# Patient Record
Sex: Female | Born: 1980 | Race: White | Hispanic: No | Marital: Married | Smoking: Never smoker
Health system: Southern US, Community
[De-identification: ages and names within clinical notes are randomized; demographics above are authoritative.]

## PROBLEM LIST (undated history)

## (undated) HISTORY — PX: OTHER SURGICAL HISTORY: SHX169

## (undated) HISTORY — PX: HAMSTRING AVULSION REPAIR: SHX5019

## (undated) HISTORY — PX: BLADDER SUSPENSION: SHX72

## (undated) HISTORY — PX: WISDOM TOOTH EXTRACTION: SHX21

## (undated) HISTORY — PX: BREAST SURGERY: SHX581

---

## 2017-08-06 ENCOUNTER — Ambulatory Visit (INDEPENDENT_AMBULATORY_CARE_PROVIDER_SITE_OTHER): Payer: BLUE CROSS/BLUE SHIELD | Admitting: Orthopaedic Surgery

## 2017-08-06 ENCOUNTER — Encounter (INDEPENDENT_AMBULATORY_CARE_PROVIDER_SITE_OTHER): Payer: Self-pay | Admitting: Orthopaedic Surgery

## 2017-08-06 ENCOUNTER — Ambulatory Visit (INDEPENDENT_AMBULATORY_CARE_PROVIDER_SITE_OTHER): Payer: BLUE CROSS/BLUE SHIELD

## 2017-08-06 DIAGNOSIS — L920 Granuloma annulare: Secondary | ICD-10-CM | POA: Diagnosis not present

## 2017-08-06 DIAGNOSIS — M25552 Pain in left hip: Secondary | ICD-10-CM

## 2017-08-06 DIAGNOSIS — L700 Acne vulgaris: Secondary | ICD-10-CM | POA: Diagnosis not present

## 2017-08-06 DIAGNOSIS — L218 Other seborrheic dermatitis: Secondary | ICD-10-CM | POA: Diagnosis not present

## 2017-08-06 MED ORDER — LIDOCAINE HCL 1 % IJ SOLN
3.0000 mL | INTRAMUSCULAR | Status: AC | PRN
  Administered 2017-08-06: 3 mL

## 2017-08-06 MED ORDER — BUPIVACAINE HCL 0.5 % IJ SOLN
3.0000 mL | INTRAMUSCULAR | Status: AC | PRN
  Administered 2017-08-06: 3 mL via INTRA_ARTICULAR

## 2017-08-06 MED ORDER — METHYLPREDNISOLONE ACETATE 40 MG/ML IJ SUSP
40.0000 mg | INTRAMUSCULAR | Status: AC | PRN
  Administered 2017-08-06: 40 mg via INTRA_ARTICULAR

## 2017-08-06 MED ORDER — DICLOFENAC EPOLAMINE 1.3 % TD PTCH: MEDICATED_PATCH | TRANSDERMAL | 1 refills | Status: DC

## 2017-08-06 NOTE — Progress Notes (Signed)
Office Visit Note   Patient: Dawn Maynard           Date of Birth: [birth year]           MRN: [id number] Visit Date: 08/06/2017              Requested by: No referring provider defined for this encounter. PCP: Patient, No Pcp Per   Assessment & Plan: Visit Diagnoses:  1. 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  We performed an injection into the symptomatic area under sterile conditions.  Patient did have good relief during the anesthetic phase.  I have also recommended foam rolling, physical therapy with modalities, NSAIDs as needed, heat as needed, Flector patches especially during running.    Follow-Up Instructions: Return if symptoms worsen or fail to improve.   Orders:  Orders Placed This Encounter  Procedures  . XR HIP UNILAT W OR W/O PELVIS 2-3 VIEWS LEFT   Meds ordered this encounter  Medications  . diclofenac (FLECTOR) 1.3 % PTCH    Sig: Cut the patch down to appropriate size and apply to affected area every 12 hrs    Dispense:  30 patch    Refill:  1      Procedures: Large Joint Inj: L greater trochanter on 08/06/2017 11:33 AM Indications: pain Details: 22 G needle Medications: 3 mL lidocaine 1 %; 3 mL bupivacaine 0.5 %; 40 mg methylPREDNISolone acetate 40 MG/ML Outcome: tolerated well, no immediate complications Patient was prepped and draped in the usual sterile fashion.       Clinical Data: No additional findings.   Subjective: Chief Complaint  Patient presents with  . 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  The pain is constant.  She does not endorse any radiation of pain.  Denies any numbness and tingling.  She is able to run through the pain but this is quite symptomatic.  She  denies any lateral hip pain or groin pain.  Denies any injuries.    Review of Systems  Constitutional: Negative.   HENT: Negative.   Eyes: Negative.   Respiratory: Negative.   Cardiovascular: Negative.   Endocrine: Negative.   Musculoskeletal: Negative.   Neurological: Negative.   Hematological: Negative.   Psychiatric/Behavioral: Negative.   All other systems reviewed and are negative.    Objective: Vital Signs: There were no vitals taken for this visit.  Physical Exam  Constitutional: She is oriented to person, place, and time. She appears well-developed and well-nourished.  HENT:  Head: Normocephalic and atraumatic.  Eyes: EOM are normal.  Neck: Neck supple.  Pulmonary/Chest: Effort normal.  Abdominal: Soft.  Neurological: She is alert and oriented to person, place, and time.  Skin: Skin is warm. Capillary refill takes less than 2 seconds.  Psychiatric: She has a normal mood and affect. Her behavior is normal. Judgment and thought content normal.  Nursing note and vitals reviewed.   Ortho Exam Left hip exam shows full painless range of motion without any reproducible pain.  Her pain is localized to just posterior to the iliac wing at the insertion of the gluteus maximus.  There is no overlying skin changes or swelling.  Trochanter is nontender.  Negative Stinchfield sign.  Negative straight leg. Specialty Comments:  No specialty comments available.  Imaging: Xr Hip Unilat W Or W/o Pelvis 2-3 Views Left  Result Date: 08/06/2017 No acute or structural abnormalities.      PMFS History: There are no active problems to display for this patient.  History reviewed. No pertinent past medical history.  History reviewed. No pertinent family history.  History reviewed. No pertinent surgical history. Social History   Occupational History  . Not on file  Tobacco Use  . Smoking status: Never Smoker  . Smokeless tobacco: Never Used  Substance and Sexual Activity  .  Alcohol use: Not on file  . Drug use: Not on file  . Sexual activity: Not on file

## 2017-08-09 ENCOUNTER — Ambulatory Visit (INDEPENDENT_AMBULATORY_CARE_PROVIDER_SITE_OTHER): Payer: Self-pay | Admitting: Orthopaedic Surgery

## 2017-08-18 ENCOUNTER — Telehealth (INDEPENDENT_AMBULATORY_CARE_PROVIDER_SITE_OTHER): Payer: Self-pay | Admitting: Orthopaedic Surgery

## 2017-08-18 ENCOUNTER — Other Ambulatory Visit (INDEPENDENT_AMBULATORY_CARE_PROVIDER_SITE_OTHER): Payer: Self-pay | Admitting: Orthopaedic Surgery

## 2017-08-18 DIAGNOSIS — M25552 Pain in left hip: Secondary | ICD-10-CM

## 2017-08-18 MED ORDER — METHYLPREDNISOLONE 4 MG PO TBPK: ORAL_TABLET | ORAL | 0 refills | Status: DC

## 2017-08-18 NOTE — Telephone Encounter (Signed)
Spoke with her on phone.  We're going to plan on 4 weeks off from running.  In the meantime, she'll swim or bike.  I'll send in prednisone taper.  If you can please fax in order to [facility] for dry needling for her.  Eval and treat.  If not better, then MRI of the pelvis.  Thanks.

## 2017-08-18 NOTE — Telephone Encounter (Signed)
See message below  Please advise

## 2017-08-18 NOTE — Telephone Encounter (Signed)
Order faxed.

## 2017-08-18 NOTE — Telephone Encounter (Signed)
Patient called asking about the medication for the hip swelling, also wanted to know if you would suggest an MRI. CB # [phone]

## 2017-08-20 ENCOUNTER — Telehealth (INDEPENDENT_AMBULATORY_CARE_PROVIDER_SITE_OTHER): Payer: Self-pay | Admitting: *Deleted

## 2017-08-20 NOTE — Telephone Encounter (Signed)
Received fax from [facility]  [address] advising of appt scheduled on 08/26/17, per fax pt aware fo appt

## 2017-08-26 DIAGNOSIS — M6281 Muscle weakness (generalized): Secondary | ICD-10-CM | POA: Diagnosis not present

## 2017-08-26 DIAGNOSIS — M25652 Stiffness of left hip, not elsewhere classified: Secondary | ICD-10-CM | POA: Diagnosis not present

## 2017-08-26 DIAGNOSIS — M25552 Pain in left hip: Secondary | ICD-10-CM | POA: Diagnosis not present

## 2017-08-26 DIAGNOSIS — R262 Difficulty in walking, not elsewhere classified: Secondary | ICD-10-CM | POA: Diagnosis not present

## 2017-09-03 DIAGNOSIS — M6281 Muscle weakness (generalized): Secondary | ICD-10-CM | POA: Diagnosis not present

## 2017-09-03 DIAGNOSIS — R262 Difficulty in walking, not elsewhere classified: Secondary | ICD-10-CM | POA: Diagnosis not present

## 2017-09-03 DIAGNOSIS — M25552 Pain in left hip: Secondary | ICD-10-CM | POA: Diagnosis not present

## 2017-09-03 DIAGNOSIS — M25652 Stiffness of left hip, not elsewhere classified: Secondary | ICD-10-CM | POA: Diagnosis not present

## 2017-09-07 DIAGNOSIS — R262 Difficulty in walking, not elsewhere classified: Secondary | ICD-10-CM | POA: Diagnosis not present

## 2017-09-07 DIAGNOSIS — M6281 Muscle weakness (generalized): Secondary | ICD-10-CM | POA: Diagnosis not present

## 2017-09-07 DIAGNOSIS — M25652 Stiffness of left hip, not elsewhere classified: Secondary | ICD-10-CM | POA: Diagnosis not present

## 2017-09-07 DIAGNOSIS — M25552 Pain in left hip: Secondary | ICD-10-CM | POA: Diagnosis not present

## 2017-09-14 DIAGNOSIS — R262 Difficulty in walking, not elsewhere classified: Secondary | ICD-10-CM | POA: Diagnosis not present

## 2017-09-14 DIAGNOSIS — M6281 Muscle weakness (generalized): Secondary | ICD-10-CM | POA: Diagnosis not present

## 2017-09-14 DIAGNOSIS — M25652 Stiffness of left hip, not elsewhere classified: Secondary | ICD-10-CM | POA: Diagnosis not present

## 2017-09-14 DIAGNOSIS — M25552 Pain in left hip: Secondary | ICD-10-CM | POA: Diagnosis not present

## 2017-09-17 DIAGNOSIS — M25652 Stiffness of left hip, not elsewhere classified: Secondary | ICD-10-CM | POA: Diagnosis not present

## 2017-09-17 DIAGNOSIS — M25552 Pain in left hip: Secondary | ICD-10-CM | POA: Diagnosis not present

## 2017-09-17 DIAGNOSIS — R262 Difficulty in walking, not elsewhere classified: Secondary | ICD-10-CM | POA: Diagnosis not present

## 2017-09-17 DIAGNOSIS — M6281 Muscle weakness (generalized): Secondary | ICD-10-CM | POA: Diagnosis not present

## 2017-09-20 DIAGNOSIS — R262 Difficulty in walking, not elsewhere classified: Secondary | ICD-10-CM | POA: Diagnosis not present

## 2017-09-20 DIAGNOSIS — M6281 Muscle weakness (generalized): Secondary | ICD-10-CM | POA: Diagnosis not present

## 2017-09-20 DIAGNOSIS — M25652 Stiffness of left hip, not elsewhere classified: Secondary | ICD-10-CM | POA: Diagnosis not present

## 2017-09-20 DIAGNOSIS — M25552 Pain in left hip: Secondary | ICD-10-CM | POA: Diagnosis not present

## 2017-09-23 DIAGNOSIS — M6281 Muscle weakness (generalized): Secondary | ICD-10-CM | POA: Diagnosis not present

## 2017-09-23 DIAGNOSIS — M25652 Stiffness of left hip, not elsewhere classified: Secondary | ICD-10-CM | POA: Diagnosis not present

## 2017-09-23 DIAGNOSIS — R262 Difficulty in walking, not elsewhere classified: Secondary | ICD-10-CM | POA: Diagnosis not present

## 2017-09-23 DIAGNOSIS — M25552 Pain in left hip: Secondary | ICD-10-CM | POA: Diagnosis not present

## 2017-10-05 DIAGNOSIS — M6281 Muscle weakness (generalized): Secondary | ICD-10-CM | POA: Diagnosis not present

## 2017-10-05 DIAGNOSIS — R262 Difficulty in walking, not elsewhere classified: Secondary | ICD-10-CM | POA: Diagnosis not present

## 2017-10-05 DIAGNOSIS — M25652 Stiffness of left hip, not elsewhere classified: Secondary | ICD-10-CM | POA: Diagnosis not present

## 2017-10-05 DIAGNOSIS — M25552 Pain in left hip: Secondary | ICD-10-CM | POA: Diagnosis not present

## 2017-11-01 DIAGNOSIS — M25652 Stiffness of left hip, not elsewhere classified: Secondary | ICD-10-CM | POA: Diagnosis not present

## 2017-11-01 DIAGNOSIS — M6281 Muscle weakness (generalized): Secondary | ICD-10-CM | POA: Diagnosis not present

## 2017-11-01 DIAGNOSIS — R262 Difficulty in walking, not elsewhere classified: Secondary | ICD-10-CM | POA: Diagnosis not present

## 2017-11-01 DIAGNOSIS — M25552 Pain in left hip: Secondary | ICD-10-CM | POA: Diagnosis not present

## 2017-11-05 DIAGNOSIS — L700 Acne vulgaris: Secondary | ICD-10-CM | POA: Diagnosis not present

## 2017-11-05 DIAGNOSIS — L218 Other seborrheic dermatitis: Secondary | ICD-10-CM | POA: Diagnosis not present

## 2017-11-05 DIAGNOSIS — L920 Granuloma annulare: Secondary | ICD-10-CM | POA: Diagnosis not present

## 2017-11-05 DIAGNOSIS — Z79899 Other long term (current) drug therapy: Secondary | ICD-10-CM | POA: Diagnosis not present

## 2017-11-11 DIAGNOSIS — M25552 Pain in left hip: Secondary | ICD-10-CM | POA: Diagnosis not present

## 2017-11-11 DIAGNOSIS — M25652 Stiffness of left hip, not elsewhere classified: Secondary | ICD-10-CM | POA: Diagnosis not present

## 2017-11-11 DIAGNOSIS — M6281 Muscle weakness (generalized): Secondary | ICD-10-CM | POA: Diagnosis not present

## 2017-11-11 DIAGNOSIS — R262 Difficulty in walking, not elsewhere classified: Secondary | ICD-10-CM | POA: Diagnosis not present

## 2017-11-22 DIAGNOSIS — L920 Granuloma annulare: Secondary | ICD-10-CM | POA: Diagnosis not present

## 2017-12-21 DIAGNOSIS — R945 Abnormal results of liver function studies: Secondary | ICD-10-CM | POA: Diagnosis not present

## 2017-12-21 DIAGNOSIS — E782 Mixed hyperlipidemia: Secondary | ICD-10-CM | POA: Diagnosis not present

## 2017-12-21 DIAGNOSIS — Z0000 Encounter for general adult medical examination without abnormal findings: Secondary | ICD-10-CM | POA: Diagnosis not present

## 2017-12-21 DIAGNOSIS — Z1329 Encounter for screening for other suspected endocrine disorder: Secondary | ICD-10-CM | POA: Diagnosis not present

## 2017-12-21 DIAGNOSIS — Z114 Encounter for screening for human immunodeficiency virus [HIV]: Secondary | ICD-10-CM | POA: Diagnosis not present

## 2017-12-24 ENCOUNTER — Other Ambulatory Visit: Payer: Self-pay | Admitting: Family Medicine

## 2017-12-24 DIAGNOSIS — Z13220 Encounter for screening for lipoid disorders: Secondary | ICD-10-CM | POA: Diagnosis not present

## 2017-12-24 DIAGNOSIS — R945 Abnormal results of liver function studies: Secondary | ICD-10-CM | POA: Diagnosis not present

## 2017-12-24 DIAGNOSIS — Z6820 Body mass index (BMI) 20.0-20.9, adult: Secondary | ICD-10-CM | POA: Diagnosis not present

## 2017-12-24 DIAGNOSIS — Z0000 Encounter for general adult medical examination without abnormal findings: Secondary | ICD-10-CM | POA: Diagnosis not present

## 2017-12-24 DIAGNOSIS — M316 Other giant cell arteritis: Secondary | ICD-10-CM | POA: Diagnosis not present

## 2017-12-24 DIAGNOSIS — R7989 Other specified abnormal findings of blood chemistry: Secondary | ICD-10-CM

## 2018-01-03 ENCOUNTER — Ambulatory Visit
Admission: RE | Admit: 2018-01-03 | Discharge: 2018-01-03 | Disposition: A | Discharge status: Home / Self Care | Payer: BLUE CROSS/BLUE SHIELD | Source: Ambulatory Visit | Attending: Family Medicine | Admitting: Family Medicine

## 2018-01-03 DIAGNOSIS — R945 Abnormal results of liver function studies: Principal | ICD-10-CM

## 2018-01-03 DIAGNOSIS — R7989 Other specified abnormal findings of blood chemistry: Secondary | ICD-10-CM

## 2018-01-03 DIAGNOSIS — D1809 Hemangioma of other sites: Secondary | ICD-10-CM | POA: Diagnosis not present

## 2018-01-21 DIAGNOSIS — Z01419 Encounter for gynecological examination (general) (routine) without abnormal findings: Secondary | ICD-10-CM | POA: Diagnosis not present

## 2018-01-21 DIAGNOSIS — Z681 Body mass index (BMI) 19 or less, adult: Secondary | ICD-10-CM | POA: Diagnosis not present

## 2018-02-01 DIAGNOSIS — R945 Abnormal results of liver function studies: Secondary | ICD-10-CM | POA: Diagnosis not present

## 2018-02-01 DIAGNOSIS — Z0000 Encounter for general adult medical examination without abnormal findings: Secondary | ICD-10-CM | POA: Diagnosis not present

## 2018-02-03 DIAGNOSIS — R945 Abnormal results of liver function studies: Secondary | ICD-10-CM | POA: Diagnosis not present

## 2018-02-03 DIAGNOSIS — Z6820 Body mass index (BMI) 20.0-20.9, adult: Secondary | ICD-10-CM | POA: Diagnosis not present

## 2018-02-23 DIAGNOSIS — Z30432 Encounter for removal of intrauterine contraceptive device: Secondary | ICD-10-CM | POA: Diagnosis not present

## 2018-03-04 DIAGNOSIS — H10811 Pingueculitis, right eye: Secondary | ICD-10-CM | POA: Diagnosis not present

## 2018-03-14 ENCOUNTER — Other Ambulatory Visit (INDEPENDENT_AMBULATORY_CARE_PROVIDER_SITE_OTHER): Payer: Self-pay | Admitting: Orthopaedic Surgery

## 2018-03-15 NOTE — Telephone Encounter (Signed)
Yes, please put 11 refills on it

## 2018-04-11 ENCOUNTER — Ambulatory Visit (INDEPENDENT_AMBULATORY_CARE_PROVIDER_SITE_OTHER): Payer: BLUE CROSS/BLUE SHIELD | Admitting: Orthopaedic Surgery

## 2018-04-11 ENCOUNTER — Other Ambulatory Visit (INDEPENDENT_AMBULATORY_CARE_PROVIDER_SITE_OTHER): Payer: Self-pay

## 2018-04-11 DIAGNOSIS — M25552 Pain in left hip: Secondary | ICD-10-CM

## 2018-04-11 DIAGNOSIS — R102 Pelvic and perineal pain: Secondary | ICD-10-CM

## 2018-04-11 NOTE — Progress Notes (Deleted)
   Office Visit Note   Patient: Dawn Maynard           Date of Birth: 1980-12-02           MRN: [id number] Visit Date: 04/11/2018              Requested by: Lewis Moccasinewey, Elizabeth R, MD [address] [place] [id number] PCP: Lewis Moccasinewey, Elizabeth R, MD   Assessment & Plan: Visit Diagnoses: Right posterior pelvic pain  Plan: Vena Austrialeanor continues to have pain in this region despite conservative treatment with relative rest and cortisone injection.  At this point she needs an MRI of the pelvis to rule out occult fracture or malignancy.  Follow-Up Instructions: No follow-ups on file.   Orders:  No orders of the defined types were placed in this encounter.  No orders of the defined types were placed in this encounter.     Procedures: No procedures performed   Clinical Data: No additional findings.   Subjective: No chief complaint on file.   Vena Austrialeanor continues to have pain along the posterior superior iliac crest that has recently caused further withdrawal from a race.  The pain does not radiate.  She denies any back pain.  Denies any groin pain or lateral hip pain.   Review of Systems   Objective: Vital Signs: There were no vitals taken for this visit.  Physical Exam  Ortho Exam She is tender to the posterior superior iliac crest.  There is no overlying skin changes.  Negative sciatic tension signs.  Painless range of motion of the hip. Specialty Comments:  No specialty comments available.  Imaging: No results found.   PMFS History: There are no active problems to display for this patient.  No past medical history on file.  No family history on file.  No past surgical history on file. Social History   Occupational History  . Not on file  Tobacco Use  . Smoking status: Never Smoker  . Smokeless tobacco: Never Used  Substance and Sexual Activity  . Alcohol use: Not on file  . Drug use: Not on file  . Sexual activity: Not on file

## 2018-04-13 ENCOUNTER — Ambulatory Visit
Admission: RE | Admit: 2018-04-13 | Discharge: 2018-04-13 | Disposition: A | Discharge status: Home / Self Care | Payer: BLUE CROSS/BLUE SHIELD | Source: Ambulatory Visit | Attending: Orthopaedic Surgery | Admitting: Orthopaedic Surgery

## 2018-04-13 DIAGNOSIS — R600 Localized edema: Secondary | ICD-10-CM | POA: Diagnosis not present

## 2018-04-13 DIAGNOSIS — M25552 Pain in left hip: Secondary | ICD-10-CM

## 2018-04-13 DIAGNOSIS — R102 Pelvic and perineal pain: Secondary | ICD-10-CM

## 2018-04-15 ENCOUNTER — Ambulatory Visit (INDEPENDENT_AMBULATORY_CARE_PROVIDER_SITE_OTHER): Payer: BLUE CROSS/BLUE SHIELD | Admitting: Family Medicine

## 2018-04-15 ENCOUNTER — Encounter (INDEPENDENT_AMBULATORY_CARE_PROVIDER_SITE_OTHER): Payer: Self-pay | Admitting: Family Medicine

## 2018-04-15 DIAGNOSIS — M25561 Pain in right knee: Secondary | ICD-10-CM | POA: Diagnosis not present

## 2018-04-15 DIAGNOSIS — M25551 Pain in right hip: Secondary | ICD-10-CM

## 2018-04-15 DIAGNOSIS — G8929 Other chronic pain: Secondary | ICD-10-CM | POA: Diagnosis not present

## 2018-04-15 NOTE — Progress Notes (Signed)
Office Visit Note   Patient: Dawn Maynard           Date of Birth: 1981/05/19           MRN: [id number] Visit Date: 04/15/2018 Requested by: Lewis Moccasinewey, Elizabeth R, MD [address] [place], [place] [id number] PCP: Lewis Moccasinewey, Elizabeth R, MD  Subjective: Chief Complaint  Patient presents with  . Right Hip - Pain    PRP injection    HPI: She is here with chronic right lateral hip pain.  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  She has had pain in the right posterior lateral hip for almost a year.  She had an injection cortisone lasting a couple weeks.  She had an MRI scan edema in the gluteus medius minimus muscles but no definite tear of muscle or tendon.  No sign of stress fracture.  She does note that a couple years ago she had a partial tear of her right hamstring tendon, semi-tendinosis/semi-member gnosis requiring debridement.  She states that it is better, but still hurts.  Proper foot he does not have a history of leg length discrepancy.  No history of stress fractures.               ROS: Otherwise noncontributory  Objective: Vital Signs: There were no vitals taken for this visit.  Physical Exam:  Right hip: Leg lengths are equal, no lumbar scoliosis.  No pain with passive internal/external hip rotation in the groin area.  Her tenderness is at the lateral hip about 3 cm below the iliac crest and 2 cm behind the anterior superior iliac spine.  The muscle seems slightly atrophied compared to the left side.  No pain over the greater trochanter. Right knee: Surgical scar at the medial knee, still slightly tender near the medial hamstring tendons.  Imaging: Musculoskeletal ultrasound: Right hip was imaged in the area of maximum tenderness.  There appears to be hypoechoic swelling in the gluteus medius muscle tissue.  Right knee was imaged as well at the area of maximum tenderness.  It is anterior to the hamstring tendons.  No definite abnormality  seen.  Assessment & Plan: 1.  Chronic right lateral hip pain with gluteus medius/minimus muscle strain, question whether due to favoring her right hamstring injury at the knee. -We will try PRP injection today.  She will rest for a few days and then gradually resume her training regimen.   -We will also inject her right knee today with PRP.   Follow-Up Instructions: No follow-ups on file.       Procedures: Right hip and right knee PRP injection: After sterile prep with iodine, injected just under 3 cc of PRP using a 22-gauge spinal needle into the hypoechoic muscle tissue of the right gluteus medius without complication.  After sterile prep with Betadine, injected 2 cc PRP using a 22-gauge 1-1/4 inch needle into the area of maximum tenderness at the right medial knee.   PMFS History: There are no active problems to display for this patient.  History reviewed. No pertinent past medical history.  History reviewed. No pertinent family history.  History reviewed. No pertinent surgical history. Social History   Occupational History  . Not on file  Tobacco Use  . Smoking status: Never Smoker  . Smokeless tobacco: Never Used  Substance and Sexual Activity  . Alcohol use: Not on file  . Drug use: Not on file  . Sexual activity: Not on file

## 2018-04-16 ENCOUNTER — Other Ambulatory Visit (INDEPENDENT_AMBULATORY_CARE_PROVIDER_SITE_OTHER): Payer: Self-pay | Admitting: Orthopaedic Surgery

## 2018-04-16 MED ORDER — NITROGLYCERIN 0.2 MG/HR TD PT24: MEDICATED_PATCH | TRANSDERMAL | 12 refills | Status: DC

## 2018-05-03 DIAGNOSIS — L218 Other seborrheic dermatitis: Secondary | ICD-10-CM | POA: Diagnosis not present

## 2018-05-03 DIAGNOSIS — L920 Granuloma annulare: Secondary | ICD-10-CM | POA: Diagnosis not present

## 2018-06-15 DIAGNOSIS — R51 Headache: Secondary | ICD-10-CM | POA: Diagnosis not present

## 2018-06-15 DIAGNOSIS — Z6820 Body mass index (BMI) 20.0-20.9, adult: Secondary | ICD-10-CM | POA: Diagnosis not present

## 2018-06-15 DIAGNOSIS — E559 Vitamin D deficiency, unspecified: Secondary | ICD-10-CM | POA: Diagnosis not present

## 2018-06-15 DIAGNOSIS — J309 Allergic rhinitis, unspecified: Secondary | ICD-10-CM | POA: Diagnosis not present

## 2018-06-15 DIAGNOSIS — R5383 Other fatigue: Secondary | ICD-10-CM | POA: Diagnosis not present

## 2018-06-21 ENCOUNTER — Encounter (INDEPENDENT_AMBULATORY_CARE_PROVIDER_SITE_OTHER): Payer: Self-pay | Admitting: Family Medicine

## 2018-06-21 ENCOUNTER — Ambulatory Visit (INDEPENDENT_AMBULATORY_CARE_PROVIDER_SITE_OTHER): Payer: BLUE CROSS/BLUE SHIELD | Admitting: Family Medicine

## 2018-06-21 DIAGNOSIS — M25551 Pain in right hip: Secondary | ICD-10-CM | POA: Diagnosis not present

## 2018-06-21 MED ORDER — PREDNISONE 10 MG PO TABS: ORAL_TABLET | ORAL | 0 refills | Status: AC

## 2018-06-21 MED ORDER — METHYLPREDNISOLONE ACETATE 40 MG/ML IJ SUSP
40.0000 mg | INTRAMUSCULAR | Status: AC | PRN
  Administered 2018-06-21: 40 mg via INTRA_ARTICULAR

## 2018-06-21 MED ORDER — LIDOCAINE HCL (PF) 1 % IJ SOLN
5.0000 mL | INTRAMUSCULAR | Status: AC | PRN
  Administered 2018-06-21: 5 mL

## 2018-06-21 NOTE — Progress Notes (Signed)
   Office Visit Note   Patient: Dawn Maynard           Date of Birth: [birth year]           MRN: [id number] Visit Date: 06/21/2018 Requested by: Lewis Moccasinewey, Elizabeth R, MD [address] [place], [place] [id number] PCP: Lewis Moccasinewey, Elizabeth R, MD  Subjective: Chief Complaint  Patient presents with  . Right Hip - Pain, Follow-up    Ultrasound-guided injection    HPI: She is here with recurrent right hip pain.  PRP injection did not help.  It definitely helped her knee pain but not her hip.  She has a qualifying marathon in about 2 weeks.  She would like to try a cortisone injection.              ROS: Noncontributory  Objective: Vital Signs: There were no vitals taken for this visit.  Physical Exam:  Right hip: She is tender to deep palpation of tensor fascia lata muscle, she has a symptomatic trigger point in this area that seems to be reproducing her pain.  Imaging: Musculoskeletal ultrasound: I imaged her posterior right hip.  I was able to locate the piriformis muscle exiting the foramen and identified the superior gluteal artery.  It seemed to be superior to the muscle, not within it.  I was not able to definitively see the superior gluteal nerve to determine whether there is any entrapment.  Assessment & Plan: 1.  Chronic right hip pain with edema in the gluteus minimus muscle on MRI scan, question superior gluteal nerve entrapment at level of piriformis -Discussed options with her, elected to inject the symptomatic trigger point at the area of maximum pain and also to do an ultrasound-guided injection around the superior gluteal nerve. -Oral prednisone taper.   Follow-Up Instructions: No follow-ups on file.      Procedures: Large Joint Inj: R hip joint on 06/21/2018 2:39 PM Details: 22 G 3.5 in needle, ultrasound-guided posterior approach  Arthrogram: No  Medications: 5 mL lidocaine (PF) 1 %; 40 mg methylPREDNISolone acetate 40 MG/ML  Injected 5 cc 1% lidocaine  without epinephrine and 20 mg methylprednisolone using ultrasound to guide needle placement around the superior gluteal nerve artery.  Also without ultrasound guidance, injected 3 cc without epinephrine and 20 mg methylprednisolone into the area of maximum tenderness, myofascial trigger point, at the region of the gluteus minimus muscle belly. Procedure, treatment alternatives, risks and benefits explained, specific risks discussed. Consent was given by the patient. Immediately prior to procedure a time out was called to verify the correct patient, procedure, equipment, support staff and site/side marked as required. Patient was prepped and draped in the usual sterile fashion.      No notes on file    PMFS History: There are no active problems to display for this patient.  No past medical history on file.  No family history on file.  No past surgical history on file. Social History   Occupational History  . Not on file  Tobacco Use  . Smoking status: Never Smoker  . Smokeless tobacco: Never Used  Substance and Sexual Activity  . Alcohol use: Yes    Alcohol/week: 1.0 - 3.0 standard drinks    Types: 1 - 3 Glasses of wine per week  . Drug use: Never  . Sexual activity: Not on file

## 2018-08-02 ENCOUNTER — Telehealth (INDEPENDENT_AMBULATORY_CARE_PROVIDER_SITE_OTHER): Payer: Self-pay | Admitting: Orthopaedic Surgery

## 2018-08-02 ENCOUNTER — Ambulatory Visit (INDEPENDENT_AMBULATORY_CARE_PROVIDER_SITE_OTHER): Payer: BLUE CROSS/BLUE SHIELD | Admitting: Orthopaedic Surgery

## 2018-08-02 DIAGNOSIS — M25551 Pain in right hip: Secondary | ICD-10-CM

## 2018-08-02 NOTE — Telephone Encounter (Signed)
See progress note.

## 2018-08-02 NOTE — Progress Notes (Addendum)
   Office Visit Note   Patient: Dawn Maynard           Date of Birth: 11-20-1980           MRN: [id number] Visit Date: 08/02/2018              Requested by: Lewis Moccasin, MD [address] [place], [place] [address] PCP: Lewis Moccasin, MD   Assessment & Plan: Visit Diagnoses:  1. Pain of right hip joint     Plan: In light of her recent injury and continued pain even at rest and the fact that she also has pain in her back I would like to repeat the MRI of her right hip to rule out a tear in her abductor tendon as well as an MRI of her lumbar spine to rule out structural abnormalities as a source of her continued right hip pain.  We will submit this order today and I will follow-up with her regarding the results of the MRI.  Follow-Up Instructions: Return if symptoms worsen or fail to improve.   Orders:  No orders of the defined types were placed in this encounter.  No orders of the defined types were placed in this encounter.     Procedures: No procedures performed   Clinical Data: No additional findings.   Subjective: No chief complaint on file.   Dawn Maynard is a 38 year old female who I have been treating since January 2019 for chronic right hip pain.  She had an MRI in September of her right hip which showed a small tear of her abductors and gluteal aponeurosis.  She treated this with rest and physical therapy and she tried PRP and as well as a cortisone injection.  She has not noticed a significant improvement with these measures.  She comes in today for repeat evaluation of the right hip.  She also recently had a fall onto the right hip which is now caused her symptoms to become much worse.  She states that her low back also feels tight and feels discomfort.  She states that her right hip pain is severe and now she has pain in her right hip and back even at rest.   Review of Systems   Objective: Vital Signs: There were no vitals taken for this  visit.  Physical Exam  Ortho Exam Right hip exam shows dull tenderness to palpation in the abductors as well as along the posterior iliac crest.  She has weakness and pain with resisted hip abduction. Lumbar spine is mildly tender to palpation.  Negative sciatic tension signs.  Specialty Comments:  No specialty comments available.  Imaging: No results found.   PMFS History: There are no active problems to display for this patient.  No past medical history on file.  No family history on file.  No past surgical history on file. Social History   Occupational History  . Not on file  Tobacco Use  . Smoking status: Never Smoker  . Smokeless tobacco: Never Used  Substance and Sexual Activity  . Alcohol use: Yes    Alcohol/week: 1.0 - 3.0 standard drinks    Types: 1 - 3 Glasses of wine per week  . Drug use: Never  . Sexual activity: Not on file

## 2018-08-03 ENCOUNTER — Other Ambulatory Visit (INDEPENDENT_AMBULATORY_CARE_PROVIDER_SITE_OTHER): Payer: Self-pay

## 2018-08-03 DIAGNOSIS — M545 Low back pain, unspecified: Secondary | ICD-10-CM

## 2018-08-03 DIAGNOSIS — M25551 Pain in right hip: Secondary | ICD-10-CM

## 2018-08-03 DIAGNOSIS — G8929 Other chronic pain: Secondary | ICD-10-CM

## 2018-08-07 ENCOUNTER — Ambulatory Visit
Admission: RE | Admit: 2018-08-07 | Discharge: 2018-08-07 | Disposition: A | Discharge status: Home / Self Care | Payer: BLUE CROSS/BLUE SHIELD | Source: Ambulatory Visit | Attending: Orthopaedic Surgery | Admitting: Orthopaedic Surgery

## 2018-08-07 DIAGNOSIS — M25551 Pain in right hip: Secondary | ICD-10-CM

## 2018-08-07 DIAGNOSIS — M545 Low back pain, unspecified: Secondary | ICD-10-CM

## 2018-08-07 DIAGNOSIS — G8929 Other chronic pain: Secondary | ICD-10-CM

## 2018-08-22 DIAGNOSIS — M5126 Other intervertebral disc displacement, lumbar region: Secondary | ICD-10-CM | POA: Diagnosis not present

## 2018-08-22 DIAGNOSIS — M9904 Segmental and somatic dysfunction of sacral region: Secondary | ICD-10-CM | POA: Diagnosis not present

## 2018-08-22 DIAGNOSIS — M9905 Segmental and somatic dysfunction of pelvic region: Secondary | ICD-10-CM | POA: Diagnosis not present

## 2018-08-22 DIAGNOSIS — M9903 Segmental and somatic dysfunction of lumbar region: Secondary | ICD-10-CM | POA: Diagnosis not present

## 2018-08-26 DIAGNOSIS — M9904 Segmental and somatic dysfunction of sacral region: Secondary | ICD-10-CM | POA: Diagnosis not present

## 2018-08-26 DIAGNOSIS — M9905 Segmental and somatic dysfunction of pelvic region: Secondary | ICD-10-CM | POA: Diagnosis not present

## 2018-08-26 DIAGNOSIS — M5126 Other intervertebral disc displacement, lumbar region: Secondary | ICD-10-CM | POA: Diagnosis not present

## 2018-08-26 DIAGNOSIS — M9903 Segmental and somatic dysfunction of lumbar region: Secondary | ICD-10-CM | POA: Diagnosis not present

## 2018-08-28 ENCOUNTER — Other Ambulatory Visit (INDEPENDENT_AMBULATORY_CARE_PROVIDER_SITE_OTHER): Payer: Self-pay | Admitting: Orthopaedic Surgery

## 2018-08-28 MED ORDER — PREDNISONE 10 MG (21) PO TBPK: ORAL_TABLET | ORAL | 0 refills | Status: DC

## 2018-08-30 DIAGNOSIS — M9905 Segmental and somatic dysfunction of pelvic region: Secondary | ICD-10-CM | POA: Diagnosis not present

## 2018-08-30 DIAGNOSIS — M9904 Segmental and somatic dysfunction of sacral region: Secondary | ICD-10-CM | POA: Diagnosis not present

## 2018-08-30 DIAGNOSIS — M5126 Other intervertebral disc displacement, lumbar region: Secondary | ICD-10-CM | POA: Diagnosis not present

## 2018-08-30 DIAGNOSIS — M9903 Segmental and somatic dysfunction of lumbar region: Secondary | ICD-10-CM | POA: Diagnosis not present

## 2018-09-07 DIAGNOSIS — M9904 Segmental and somatic dysfunction of sacral region: Secondary | ICD-10-CM | POA: Diagnosis not present

## 2018-09-07 DIAGNOSIS — M5126 Other intervertebral disc displacement, lumbar region: Secondary | ICD-10-CM | POA: Diagnosis not present

## 2018-09-07 DIAGNOSIS — M9903 Segmental and somatic dysfunction of lumbar region: Secondary | ICD-10-CM | POA: Diagnosis not present

## 2018-09-07 DIAGNOSIS — M9905 Segmental and somatic dysfunction of pelvic region: Secondary | ICD-10-CM | POA: Diagnosis not present

## 2018-11-14 DIAGNOSIS — L218 Other seborrheic dermatitis: Secondary | ICD-10-CM | POA: Diagnosis not present

## 2018-11-14 DIAGNOSIS — L920 Granuloma annulare: Secondary | ICD-10-CM | POA: Diagnosis not present

## 2019-01-31 ENCOUNTER — Other Ambulatory Visit: Payer: Self-pay | Admitting: Orthopaedic Surgery

## 2019-01-31 MED ORDER — PREDNISONE 10 MG (21) PO TBPK: ORAL_TABLET | ORAL | 0 refills | Status: DC

## 2019-01-31 MED ORDER — CELECOXIB 200 MG PO CAPS: 200.0000 mg | ORAL_CAPSULE | Freq: Two times a day (BID) | ORAL | 3 refills | Status: DC

## 2019-02-20 DIAGNOSIS — L218 Other seborrheic dermatitis: Secondary | ICD-10-CM | POA: Diagnosis not present

## 2019-02-20 DIAGNOSIS — L920 Granuloma annulare: Secondary | ICD-10-CM | POA: Diagnosis not present

## 2019-03-02 DIAGNOSIS — Z01419 Encounter for gynecological examination (general) (routine) without abnormal findings: Secondary | ICD-10-CM | POA: Diagnosis not present

## 2019-03-02 DIAGNOSIS — Z681 Body mass index (BMI) 19 or less, adult: Secondary | ICD-10-CM | POA: Diagnosis not present

## 2019-03-20 DIAGNOSIS — R1903 Right lower quadrant abdominal swelling, mass and lump: Secondary | ICD-10-CM | POA: Diagnosis not present

## 2019-04-06 DIAGNOSIS — M25551 Pain in right hip: Secondary | ICD-10-CM | POA: Diagnosis not present

## 2019-04-06 DIAGNOSIS — M25561 Pain in right knee: Secondary | ICD-10-CM | POA: Diagnosis not present

## 2019-04-06 DIAGNOSIS — M25571 Pain in right ankle and joints of right foot: Secondary | ICD-10-CM | POA: Diagnosis not present

## 2019-04-06 DIAGNOSIS — M545 Low back pain: Secondary | ICD-10-CM | POA: Diagnosis not present

## 2019-04-11 DIAGNOSIS — M25551 Pain in right hip: Secondary | ICD-10-CM | POA: Diagnosis not present

## 2019-04-11 DIAGNOSIS — M545 Low back pain: Secondary | ICD-10-CM | POA: Diagnosis not present

## 2019-04-11 DIAGNOSIS — M25561 Pain in right knee: Secondary | ICD-10-CM | POA: Diagnosis not present

## 2019-04-11 DIAGNOSIS — M25571 Pain in right ankle and joints of right foot: Secondary | ICD-10-CM | POA: Diagnosis not present

## 2019-04-14 DIAGNOSIS — M25571 Pain in right ankle and joints of right foot: Secondary | ICD-10-CM | POA: Diagnosis not present

## 2019-04-14 DIAGNOSIS — M25551 Pain in right hip: Secondary | ICD-10-CM | POA: Diagnosis not present

## 2019-04-14 DIAGNOSIS — M25561 Pain in right knee: Secondary | ICD-10-CM | POA: Diagnosis not present

## 2019-04-14 DIAGNOSIS — M545 Low back pain: Secondary | ICD-10-CM | POA: Diagnosis not present

## 2019-04-17 ENCOUNTER — Other Ambulatory Visit (INDEPENDENT_AMBULATORY_CARE_PROVIDER_SITE_OTHER): Payer: Self-pay | Admitting: Orthopaedic Surgery

## 2019-04-18 DIAGNOSIS — M25561 Pain in right knee: Secondary | ICD-10-CM | POA: Diagnosis not present

## 2019-04-18 DIAGNOSIS — M25551 Pain in right hip: Secondary | ICD-10-CM | POA: Diagnosis not present

## 2019-04-18 DIAGNOSIS — M545 Low back pain: Secondary | ICD-10-CM | POA: Diagnosis not present

## 2019-04-18 DIAGNOSIS — M25571 Pain in right ankle and joints of right foot: Secondary | ICD-10-CM | POA: Diagnosis not present

## 2019-04-18 NOTE — Telephone Encounter (Signed)
Ok to rf? 

## 2019-04-18 NOTE — Telephone Encounter (Signed)
sure

## 2019-04-21 DIAGNOSIS — M25551 Pain in right hip: Secondary | ICD-10-CM | POA: Diagnosis not present

## 2019-04-21 DIAGNOSIS — M25571 Pain in right ankle and joints of right foot: Secondary | ICD-10-CM | POA: Diagnosis not present

## 2019-04-21 DIAGNOSIS — M25561 Pain in right knee: Secondary | ICD-10-CM | POA: Diagnosis not present

## 2019-04-21 DIAGNOSIS — M545 Low back pain: Secondary | ICD-10-CM | POA: Diagnosis not present

## 2019-04-25 DIAGNOSIS — M545 Low back pain: Secondary | ICD-10-CM | POA: Diagnosis not present

## 2019-04-25 DIAGNOSIS — M25551 Pain in right hip: Secondary | ICD-10-CM | POA: Diagnosis not present

## 2019-04-25 DIAGNOSIS — M25571 Pain in right ankle and joints of right foot: Secondary | ICD-10-CM | POA: Diagnosis not present

## 2019-04-25 DIAGNOSIS — M25561 Pain in right knee: Secondary | ICD-10-CM | POA: Diagnosis not present

## 2019-04-27 DIAGNOSIS — M25571 Pain in right ankle and joints of right foot: Secondary | ICD-10-CM | POA: Diagnosis not present

## 2019-04-27 DIAGNOSIS — M25551 Pain in right hip: Secondary | ICD-10-CM | POA: Diagnosis not present

## 2019-04-27 DIAGNOSIS — M25561 Pain in right knee: Secondary | ICD-10-CM | POA: Diagnosis not present

## 2019-04-27 DIAGNOSIS — M545 Low back pain: Secondary | ICD-10-CM | POA: Diagnosis not present

## 2019-05-01 DIAGNOSIS — M25561 Pain in right knee: Secondary | ICD-10-CM | POA: Diagnosis not present

## 2019-05-01 DIAGNOSIS — M25571 Pain in right ankle and joints of right foot: Secondary | ICD-10-CM | POA: Diagnosis not present

## 2019-05-01 DIAGNOSIS — M545 Low back pain: Secondary | ICD-10-CM | POA: Diagnosis not present

## 2019-05-01 DIAGNOSIS — M25551 Pain in right hip: Secondary | ICD-10-CM | POA: Diagnosis not present

## 2019-05-11 DIAGNOSIS — M545 Low back pain: Secondary | ICD-10-CM | POA: Diagnosis not present

## 2019-05-11 DIAGNOSIS — M25571 Pain in right ankle and joints of right foot: Secondary | ICD-10-CM | POA: Diagnosis not present

## 2019-05-11 DIAGNOSIS — M25551 Pain in right hip: Secondary | ICD-10-CM | POA: Diagnosis not present

## 2019-05-11 DIAGNOSIS — M25561 Pain in right knee: Secondary | ICD-10-CM | POA: Diagnosis not present

## 2019-05-18 DIAGNOSIS — M25551 Pain in right hip: Secondary | ICD-10-CM | POA: Diagnosis not present

## 2019-05-18 DIAGNOSIS — M25571 Pain in right ankle and joints of right foot: Secondary | ICD-10-CM | POA: Diagnosis not present

## 2019-05-18 DIAGNOSIS — M25561 Pain in right knee: Secondary | ICD-10-CM | POA: Diagnosis not present

## 2019-05-18 DIAGNOSIS — M545 Low back pain: Secondary | ICD-10-CM | POA: Diagnosis not present

## 2019-05-25 ENCOUNTER — Ambulatory Visit (INDEPENDENT_AMBULATORY_CARE_PROVIDER_SITE_OTHER): Payer: BC Managed Care – PPO | Admitting: Orthopaedic Surgery

## 2019-05-25 ENCOUNTER — Other Ambulatory Visit: Payer: Self-pay

## 2019-05-25 ENCOUNTER — Ambulatory Visit (INDEPENDENT_AMBULATORY_CARE_PROVIDER_SITE_OTHER): Payer: BC Managed Care – PPO

## 2019-05-25 ENCOUNTER — Ambulatory Visit: Payer: Self-pay

## 2019-05-25 ENCOUNTER — Encounter: Payer: Self-pay | Admitting: Orthopaedic Surgery

## 2019-05-25 DIAGNOSIS — M25561 Pain in right knee: Secondary | ICD-10-CM | POA: Diagnosis not present

## 2019-05-25 DIAGNOSIS — M545 Low back pain: Secondary | ICD-10-CM | POA: Diagnosis not present

## 2019-05-25 DIAGNOSIS — M79671 Pain in right foot: Secondary | ICD-10-CM | POA: Diagnosis not present

## 2019-05-25 DIAGNOSIS — M25571 Pain in right ankle and joints of right foot: Secondary | ICD-10-CM | POA: Diagnosis not present

## 2019-05-25 DIAGNOSIS — M25551 Pain in right hip: Secondary | ICD-10-CM | POA: Diagnosis not present

## 2019-05-25 MED ORDER — PREDNISONE 10 MG (21) PO TBPK: ORAL_TABLET | ORAL | 0 refills | Status: AC

## 2019-05-25 NOTE — Addendum Note (Signed)
Addended by: Precious Bard on: 05/25/2019 02:09 PM   Modules accepted: Orders

## 2019-05-25 NOTE — Progress Notes (Addendum)
Office Visit Note   Patient: Dawn Maynard           Date of Birth: Jan 20, 1981           MRN: [id number] Visit Date: 05/25/2019              Requested by: Lewis Moccasin, MD [address] [place],  [place] [address] PCP: Lewis Moccasin, MD   Assessment & Plan: Visit Diagnoses:  1. Pain of right heel     Plan: Overall my concern is that she has a calcaneal stress fracture or reaction.  She does have an upcoming race in 6 weeks which she would like to participate in therefore we need to obtain an MRI of her right heel to rule out stress fracture or reaction.  I think that she also has a component of plantar fasciitis but I do not feel that this explains all of her symptoms.  We did put her in a cam boot today.  She is to refrain from all running until we have the results of the MRI.  We will hold off on a plantar fascial injection until we have the MRI.  She will take a prednisone Dosepak and then follow that up with 2 weeks of regularly scheduled NSAIDs.  I will be in touch with the patient regarding the MRI results.  Follow-Up Instructions: Return if symptoms worsen or fail to improve.   Orders:  Orders Placed This Encounter  Procedures  . XR Ankle Complete Right  . DG Os Calcis Right  . XR Os Calcis Right  . MR HEEL RIGHT WO CONTRAST   Meds ordered this encounter  Medications  . predniSONE (STERAPRED UNI-PAK 21 TAB) 10 MG (21) TBPK tablet    Sig: Take as directed    Dispense:  21 tablet    Refill:  0      Procedures: No procedures performed   Clinical Data: No additional findings.   Subjective: No chief complaint on file.   Dawn Maynard is a friend of mine who comes in for evaluation of chronic and progressively worsening right heel pain over the last couple of weeks.  She denies any injuries.  She is a high-level competitive runner.  She states that the pain has progressively gotten worse with increasing mileage and now hurts constantly even with normal  walking.  Pain is worse with activity.  The pain is not necessarily worse in the morning or after inactivity.  Denies any numbness and tingling.  She did have an episode of swelling in her right ankle and heel a couple days ago.  NSAIDs and Tylenol do not provide much relief.   Review of Systems   Objective: Vital Signs: There were no vitals taken for this visit.  Physical Exam  Ortho Exam Right heel and ankle exam shows no swelling at this time.  She has no focal motor or sensory deficits.  She has no tenderness at the insertion of the Achilles.  Posterior tibial tendon is nontender.  Peroneal tendons are nontender.  She is quite symptomatic to point tenderness on the plantar aspect of the heel as well as squeezing of the calcaneus.  Specialty Comments:  No specialty comments available.  Imaging: No results found.   PMFS History: Patient Active Problem List   Diagnosis Date Noted  . Pain of right heel 05/25/2019   History reviewed. No pertinent past medical history.  History reviewed. No pertinent family history.  History reviewed. No pertinent  surgical history. Social History   Occupational History  . Not on file  Tobacco Use  . Smoking status: Never Smoker  . Smokeless tobacco: Never Used  Substance and Sexual Activity  . Alcohol use: Yes    Alcohol/week: 1.0 - 3.0 standard drinks    Types: 1 - 3 Glasses of wine per week  . Drug use: Never  . Sexual activity: Not on file

## 2019-05-29 ENCOUNTER — Encounter: Payer: Self-pay | Admitting: Family Medicine

## 2019-05-29 ENCOUNTER — Ambulatory Visit: Payer: Self-pay

## 2019-05-29 ENCOUNTER — Other Ambulatory Visit: Payer: Self-pay

## 2019-05-29 ENCOUNTER — Other Ambulatory Visit: Payer: BC Managed Care – PPO

## 2019-05-29 ENCOUNTER — Ambulatory Visit (INDEPENDENT_AMBULATORY_CARE_PROVIDER_SITE_OTHER): Payer: BC Managed Care – PPO | Admitting: Family Medicine

## 2019-05-29 DIAGNOSIS — M79671 Pain in right foot: Secondary | ICD-10-CM | POA: Diagnosis not present

## 2019-05-29 NOTE — Progress Notes (Signed)
Subjective: She is here for ultrasound evaluation of her right heel.  Plantar pain but also medial and lateral calcaneal pain.  She is training for a marathon and has an MRI scan ordered, but at this point scheduling is about 2 weeks out.  She needs to know whether she has a stress fracture so that she can continue training if possible.  Objective: Extremely tender at the medial origin of the plantar fascia.  Also moderately tender over the medial and lateral calcaneus.  Musculoskeletal ultrasound: Limited diagnostic evaluation shows a deep partial tear of the plantar fascia at the calcaneal surface.  Evaluation of the bony cortex of the medial and lateral calcaneus does not show an obvious stress reaction.  She has hyperemia on power Doppler imaging on both sides, but has the same pattern in the asymptomatic left heel.  Impression/plan: Right heel pain with plantar fasciitis and deep partial tear, cannot rule out calcaneal stress fracture. -Proceed with MRI scan.  If no stress fracture found, could contemplate prolotherapy with dextrose or a one-time cortisone injection.

## 2019-05-30 ENCOUNTER — Ambulatory Visit
Admission: RE | Admit: 2019-05-30 | Discharge: 2019-05-30 | Disposition: A | Discharge status: Home / Self Care | Payer: BC Managed Care – PPO | Source: Ambulatory Visit | Attending: Orthopaedic Surgery | Admitting: Orthopaedic Surgery

## 2019-05-30 DIAGNOSIS — M79671 Pain in right foot: Secondary | ICD-10-CM

## 2019-05-30 DIAGNOSIS — R600 Localized edema: Secondary | ICD-10-CM | POA: Diagnosis not present

## 2019-06-02 ENCOUNTER — Ambulatory Visit: Payer: Self-pay | Admitting: Orthopaedic Surgery

## 2019-06-09 ENCOUNTER — Other Ambulatory Visit: Payer: BC Managed Care – PPO

## 2019-06-26 ENCOUNTER — Encounter: Payer: Self-pay | Admitting: Family Medicine

## 2019-06-27 ENCOUNTER — Other Ambulatory Visit: Payer: Self-pay

## 2019-06-27 ENCOUNTER — Encounter: Payer: Self-pay | Admitting: Family Medicine

## 2019-06-27 ENCOUNTER — Ambulatory Visit (INDEPENDENT_AMBULATORY_CARE_PROVIDER_SITE_OTHER): Payer: BC Managed Care – PPO | Admitting: Family Medicine

## 2019-06-27 ENCOUNTER — Ambulatory Visit: Payer: Self-pay

## 2019-06-27 DIAGNOSIS — N390 Urinary tract infection, site not specified: Secondary | ICD-10-CM | POA: Diagnosis not present

## 2019-06-27 DIAGNOSIS — M84376S Stress fracture, unspecified foot, sequela: Secondary | ICD-10-CM | POA: Diagnosis not present

## 2019-06-27 DIAGNOSIS — M79671 Pain in right foot: Secondary | ICD-10-CM | POA: Diagnosis not present

## 2019-06-27 DIAGNOSIS — Z719 Counseling, unspecified: Secondary | ICD-10-CM | POA: Diagnosis not present

## 2019-06-27 MED ORDER — TRAMADOL HCL 50 MG PO TABS: 50.0000 mg | ORAL_TABLET | Freq: Four times a day (QID) | ORAL | 0 refills | Status: AC | PRN

## 2019-06-27 NOTE — Progress Notes (Signed)
   Office Visit Note   Patient: Dawn Maynard           Date of Birth: Sep 21, 1980           MRN: [id number] Visit Date: 06/27/2019 Requested by: Fanny Bien, [address] [place],  [place] [address] PCP: Fanny Bien, MD  Subjective: Chief Complaint  Patient presents with  . Right Foot - Pain    Right heel prolotherapy    HPI: She is here with persistent right heel pain.  She has been resting for about a month but her pain does not seem to be improving.  She is interested in trying prolotherapy with dextrose.              ROS:   All other systems were reviewed and are negative.  Objective: Vital Signs: There were no vitals taken for this visit.  Physical Exam:  General:  Alert and oriented, in no acute distress. Pulm:  Breathing unlabored. Psy:  Normal mood, congruent affect. Skin:  No rash.  Right heel: She is point tender at the origin of the plantar fascia.   Imaging: MSK-US:  Deep partial plantar fascia tear confirmed.   Assessment & Plan: 1.  Right heel plantar fasciitis - Prolotherapy with dextrose today.  Repeat in 3-4 weeks if needed.     Procedures: Ultrasound-guided right heel injection: After sterile prep with Betadine, injected 5 cc of a 20% dextrose solution (with 1% lidocaine, no epi) into the partial tear.      PMFS History: Patient Active Problem List   Diagnosis Date Noted  . Pain of right heel 05/25/2019   History reviewed. No pertinent past medical history.  History reviewed. No pertinent family history.  History reviewed. No pertinent surgical history. Social History   Occupational History  . Not on file  Tobacco Use  . Smoking status: Never Smoker  . Smokeless tobacco: Never Used  Substance and Sexual Activity  . Alcohol use: Yes    Alcohol/week: 1.0 - 3.0 standard drinks    Types: 1 - 3 Glasses of wine per week  . Drug use: Never  . Sexual activity: Not on file

## 2019-06-28 ENCOUNTER — Ambulatory Visit: Payer: BC Managed Care – PPO | Admitting: Family Medicine

## 2019-07-05 DIAGNOSIS — L738 Other specified follicular disorders: Secondary | ICD-10-CM | POA: Diagnosis not present

## 2019-07-05 DIAGNOSIS — L700 Acne vulgaris: Secondary | ICD-10-CM | POA: Diagnosis not present

## 2019-07-05 DIAGNOSIS — L218 Other seborrheic dermatitis: Secondary | ICD-10-CM | POA: Diagnosis not present

## 2019-07-06 DIAGNOSIS — N390 Urinary tract infection, site not specified: Secondary | ICD-10-CM | POA: Diagnosis not present

## 2019-08-04 DIAGNOSIS — Z1331 Encounter for screening for depression: Secondary | ICD-10-CM | POA: Diagnosis not present

## 2019-08-04 DIAGNOSIS — Z0000 Encounter for general adult medical examination without abnormal findings: Secondary | ICD-10-CM | POA: Diagnosis not present

## 2019-08-07 DIAGNOSIS — Z13220 Encounter for screening for lipoid disorders: Secondary | ICD-10-CM | POA: Diagnosis not present

## 2019-08-07 DIAGNOSIS — Z20828 Contact with and (suspected) exposure to other viral communicable diseases: Secondary | ICD-10-CM | POA: Diagnosis not present

## 2019-08-07 DIAGNOSIS — Z0000 Encounter for general adult medical examination without abnormal findings: Secondary | ICD-10-CM | POA: Diagnosis not present

## 2019-08-08 DIAGNOSIS — R82998 Other abnormal findings in urine: Secondary | ICD-10-CM | POA: Diagnosis not present

## 2019-08-21 ENCOUNTER — Encounter: Payer: Self-pay | Admitting: Family Medicine

## 2019-08-24 ENCOUNTER — Encounter: Payer: Self-pay | Admitting: Family Medicine

## 2019-08-24 ENCOUNTER — Other Ambulatory Visit: Payer: Self-pay

## 2019-08-24 ENCOUNTER — Ambulatory Visit: Payer: Self-pay

## 2019-08-24 ENCOUNTER — Ambulatory Visit (INDEPENDENT_AMBULATORY_CARE_PROVIDER_SITE_OTHER): Payer: BC Managed Care – PPO | Admitting: Family Medicine

## 2019-08-24 DIAGNOSIS — M79671 Pain in right foot: Secondary | ICD-10-CM

## 2019-08-24 NOTE — Progress Notes (Signed)
   Office Visit Note   Patient: Dawn Maynard           Date of Birth: 1980/12/21           MRN: [id number] Visit Date: 08/24/2019 Requested by: Lewis Moccasin, MD [address] [place],  [place] [address] PCP: Lewis Moccasin, MD  Subjective: Chief Complaint  Patient presents with  . Right Foot - Pain    Pain in heel - prolotherapy injection    HPI: She is here with recurrent right heel pain.  After last injection, she rested from running for about 10 weeks and was completely pain-free but 2 weeks ago she tried running again and almost immediately she started feeling pain in her heel but this time in a slightly different spot, a little bit more distal.  She stopped running and it still hurts but not quite as much.              ROS:   All other systems were reviewed and are negative.  Objective: Vital Signs: There were no vitals taken for this visit.  Physical Exam:  General:  Alert and oriented, in no acute distress. Pulm:  Breathing unlabored. Psy:  Normal mood, congruent affect. Skin: No bruising or erythema. Right heel: She does not have tenderness at the plantar fascia insertion point on the calcaneus today.  The pain is more medial and distal but still along the plantar fascia, there is a little bit of crepitus in that area.  Imaging: Limited diagnostic ultrasound: The previous area of injection at the calcaneus plantar fascia insertion shows evidence of scar tissue.  It is not tender to palpation over this area.  Medial and distal the plantar fascia has slight erythema on power Doppler imaging but no obvious tissue tear.  Assessment & Plan: 1.  Recurrent right heel pain due to plantar fasciitis -She would like to avoid injection at this time and try stretching, ice, and massage.  If the pain worsens we could try prolotherapy again in the area of tenderness.  If we choose to do that, we might inject her hip as well since it still tends to bother  her.     Procedures: No procedures performed  No notes on file     PMFS History: Patient Active Problem List   Diagnosis Date Noted  . Pain of right heel 05/25/2019   History reviewed. No pertinent past medical history.  History reviewed. No pertinent family history.  History reviewed. No pertinent surgical history. Social History   Occupational History  . Not on file  Tobacco Use  . Smoking status: Never Smoker  . Smokeless tobacco: Never Used  Substance and Sexual Activity  . Alcohol use: Yes    Alcohol/week: 1.0 - 3.0 standard drinks    Types: 1 - 3 Glasses of wine per week  . Drug use: Never  . Sexual activity: Not on file

## 2019-10-06 ENCOUNTER — Ambulatory Visit: Payer: BC Managed Care – PPO | Attending: Internal Medicine

## 2019-10-06 DIAGNOSIS — Z23 Encounter for immunization: Secondary | ICD-10-CM

## 2019-10-06 NOTE — Progress Notes (Signed)
   Covid-19 Vaccination Clinic  Name:  Lelania Bia    MRN: [id number] DOB: 02-12-1981  10/06/2019  Ms. Lisby was observed post Covid-19 immunization for 15 minutes without incident. She was provided with Vaccine Information Sheet and instruction to access the V-Safe system.   Ms. Dicenzo was instructed to call 911 with any severe reactions post vaccine: Marland Kitchen Difficulty breathing  . Swelling of face and throat  . A fast heartbeat  . A bad rash all over body  . Dizziness and weakness   Immunizations Administered    Name Date Dose VIS Date Route   Moderna COVID-19 Vaccine 10/06/2019 11:28 AM 0.5 mL 06/27/2019 Intramuscular   Manufacturer: Moderna   Lot: [id number]   NDC: 78412-820-81

## 2019-11-07 DIAGNOSIS — M9906 Segmental and somatic dysfunction of lower extremity: Secondary | ICD-10-CM | POA: Diagnosis not present

## 2019-11-07 DIAGNOSIS — M7918 Myalgia, other site: Secondary | ICD-10-CM | POA: Diagnosis not present

## 2019-11-07 DIAGNOSIS — M25671 Stiffness of right ankle, not elsewhere classified: Secondary | ICD-10-CM | POA: Diagnosis not present

## 2019-11-07 DIAGNOSIS — M722 Plantar fascial fibromatosis: Secondary | ICD-10-CM | POA: Diagnosis not present

## 2019-11-08 ENCOUNTER — Ambulatory Visit: Payer: BC Managed Care – PPO | Attending: Internal Medicine

## 2019-11-08 DIAGNOSIS — Z23 Encounter for immunization: Secondary | ICD-10-CM

## 2019-11-08 NOTE — Progress Notes (Signed)
   Covid-19 Vaccination Clinic  Name:  Dawn Maynard    MRN: [id number] DOB: 1981/05/13  11/08/2019  Ms. Dawn Maynard was observed post Covid-19 immunization for 15 minutes without incident. She was provided with Vaccine Information Sheet and instruction to access the V-Safe system.   Ms. Dawn Maynard was instructed to call 911 with any severe reactions post vaccine: Marland Kitchen Difficulty breathing  . Swelling of face and throat  . A fast heartbeat  . A bad rash all over body  . Dizziness and weakness   Immunizations Administered    Name Date Dose VIS Date Route   Moderna COVID-19 Vaccine 11/08/2019  9:08 AM 0.5 mL 06/27/2019 Intramuscular   Manufacturer: Moderna   Lot: [id number]   NDC: 67561-254-83

## 2019-11-21 DIAGNOSIS — M7918 Myalgia, other site: Secondary | ICD-10-CM | POA: Diagnosis not present

## 2019-11-21 DIAGNOSIS — M9906 Segmental and somatic dysfunction of lower extremity: Secondary | ICD-10-CM | POA: Diagnosis not present

## 2019-11-21 DIAGNOSIS — M722 Plantar fascial fibromatosis: Secondary | ICD-10-CM | POA: Diagnosis not present

## 2019-11-21 DIAGNOSIS — M25671 Stiffness of right ankle, not elsewhere classified: Secondary | ICD-10-CM | POA: Diagnosis not present

## 2019-11-23 DIAGNOSIS — M7918 Myalgia, other site: Secondary | ICD-10-CM | POA: Diagnosis not present

## 2019-11-23 DIAGNOSIS — M9906 Segmental and somatic dysfunction of lower extremity: Secondary | ICD-10-CM | POA: Diagnosis not present

## 2019-11-23 DIAGNOSIS — M25671 Stiffness of right ankle, not elsewhere classified: Secondary | ICD-10-CM | POA: Diagnosis not present

## 2019-11-23 DIAGNOSIS — M722 Plantar fascial fibromatosis: Secondary | ICD-10-CM | POA: Diagnosis not present

## 2019-11-27 DIAGNOSIS — M7918 Myalgia, other site: Secondary | ICD-10-CM | POA: Diagnosis not present

## 2019-11-27 DIAGNOSIS — M25671 Stiffness of right ankle, not elsewhere classified: Secondary | ICD-10-CM | POA: Diagnosis not present

## 2019-11-27 DIAGNOSIS — M722 Plantar fascial fibromatosis: Secondary | ICD-10-CM | POA: Diagnosis not present

## 2019-11-27 DIAGNOSIS — M9906 Segmental and somatic dysfunction of lower extremity: Secondary | ICD-10-CM | POA: Diagnosis not present

## 2019-12-05 ENCOUNTER — Other Ambulatory Visit: Payer: Self-pay

## 2019-12-05 ENCOUNTER — Ambulatory Visit (INDEPENDENT_AMBULATORY_CARE_PROVIDER_SITE_OTHER): Payer: BC Managed Care – PPO | Admitting: Orthopaedic Surgery

## 2019-12-05 DIAGNOSIS — M79671 Pain in right foot: Secondary | ICD-10-CM

## 2019-12-05 NOTE — Progress Notes (Signed)
   Office Visit Note   Patient: Dawn Maynard           Date of Birth: [birth year]           MRN: [id number] Visit Date: 12/05/2019              Requested by: Lewis Moccasin, MD [address] [place],  [place] [address] PCP: Lewis Moccasin, MD   Assessment & Plan: Visit Diagnoses:  1. Pain of right heel     Plan: Impression is continued right heel pain despite aggressive conservative treatment.  At this point I need to obtain a repeat MRI to assess for worsening of the plantar fascial tear or any other structural abnormalities that may need surgical attention.  Follow-Up Instructions: No follow-ups on file.   Orders:  No orders of the defined types were placed in this encounter.  No orders of the defined types were placed in this encounter.     Procedures: No procedures performed   Clinical Data: No additional findings.   Subjective: No chief complaint on file.   I am seeing Dawn Maynard today for continued right heel pain.  We had an MRI last October which showed a partial tear of the plantar fascia with other findings.  She has rested this extensively and she continues to have pain with any physical activity such as running.  She has not noticed significant improvement after conservative treatment.  We have provided her with several cortisone injections without   Review of Systems   Objective: Vital Signs: There were no vitals taken for this visit.  Physical Exam  Ortho Exam Heel exam shows point tenderness to the insertion of the plantar fascia.  She has pain with lateral compression of the calcaneus and with flexion of the toes. Specialty Comments:  No specialty comments available.  Imaging: No results found.   PMFS History: Patient Active Problem List   Diagnosis Date Noted  . Pain of right heel 05/25/2019   No past medical history on file.  No family history on file.  No past surgical history on file. Social History   Occupational  History  . Not on file  Tobacco Use  . Smoking status: Never Smoker  . Smokeless tobacco: Never Used  Substance and Sexual Activity  . Alcohol use: Yes    Alcohol/week: 1.0 - 3.0 standard drinks    Types: 1 - 3 Glasses of wine per week  . Drug use: Never  . Sexual activity: Not on file

## 2019-12-16 ENCOUNTER — Ambulatory Visit (HOSPITAL_BASED_OUTPATIENT_CLINIC_OR_DEPARTMENT_OTHER)
Admission: RE | Admit: 2019-12-16 | Discharge: 2019-12-16 | Disposition: A | Discharge status: Home / Self Care | Payer: BC Managed Care – PPO | Source: Ambulatory Visit | Attending: Orthopaedic Surgery | Admitting: Orthopaedic Surgery

## 2019-12-16 ENCOUNTER — Other Ambulatory Visit: Payer: Self-pay

## 2019-12-16 DIAGNOSIS — M79671 Pain in right foot: Secondary | ICD-10-CM | POA: Diagnosis not present

## 2019-12-16 DIAGNOSIS — S96911A Strain of unspecified muscle and tendon at ankle and foot level, right foot, initial encounter: Secondary | ICD-10-CM | POA: Diagnosis not present

## 2019-12-18 NOTE — Progress Notes (Signed)
Spoke with patient about MRI.  I cancelled her appt for Thursday.

## 2019-12-21 ENCOUNTER — Ambulatory Visit: Payer: BC Managed Care – PPO | Admitting: Orthopaedic Surgery

## 2020-01-03 ENCOUNTER — Other Ambulatory Visit: Payer: BC Managed Care – PPO

## 2020-02-25 DIAGNOSIS — R05 Cough: Secondary | ICD-10-CM | POA: Diagnosis not present

## 2020-02-25 DIAGNOSIS — B9689 Other specified bacterial agents as the cause of diseases classified elsewhere: Secondary | ICD-10-CM | POA: Diagnosis not present

## 2020-02-25 DIAGNOSIS — Z20822 Contact with and (suspected) exposure to covid-19: Secondary | ICD-10-CM | POA: Diagnosis not present

## 2020-02-25 DIAGNOSIS — J320 Chronic maxillary sinusitis: Secondary | ICD-10-CM | POA: Diagnosis not present

## 2020-03-18 DIAGNOSIS — B078 Other viral warts: Secondary | ICD-10-CM | POA: Diagnosis not present

## 2020-03-18 DIAGNOSIS — L218 Other seborrheic dermatitis: Secondary | ICD-10-CM | POA: Diagnosis not present

## 2020-03-18 DIAGNOSIS — L920 Granuloma annulare: Secondary | ICD-10-CM | POA: Diagnosis not present

## 2020-05-01 ENCOUNTER — Other Ambulatory Visit (INDEPENDENT_AMBULATORY_CARE_PROVIDER_SITE_OTHER): Payer: Self-pay | Admitting: Physician Assistant

## 2020-05-03 DIAGNOSIS — M79672 Pain in left foot: Secondary | ICD-10-CM | POA: Diagnosis not present

## 2020-05-03 DIAGNOSIS — M79671 Pain in right foot: Secondary | ICD-10-CM | POA: Diagnosis not present

## 2020-05-27 DIAGNOSIS — M79671 Pain in right foot: Secondary | ICD-10-CM | POA: Diagnosis not present

## 2020-06-10 ENCOUNTER — Other Ambulatory Visit: Payer: Self-pay | Admitting: Orthopaedic Surgery

## 2020-06-10 MED ORDER — NITROGLYCERIN 0.2 MG/HR TD PT24: MEDICATED_PATCH | TRANSDERMAL | 12 refills | Status: AC

## 2020-06-10 MED ORDER — PENNSAID 2 % EX SOLN: 2.0000 g | Freq: Two times a day (BID) | CUTANEOUS | 3 refills | Status: AC | PRN

## 2020-06-25 DIAGNOSIS — Z681 Body mass index (BMI) 19 or less, adult: Secondary | ICD-10-CM | POA: Diagnosis not present

## 2020-06-25 DIAGNOSIS — Z01419 Encounter for gynecological examination (general) (routine) without abnormal findings: Secondary | ICD-10-CM | POA: Diagnosis not present

## 2020-06-25 DIAGNOSIS — Z1329 Encounter for screening for other suspected endocrine disorder: Secondary | ICD-10-CM | POA: Diagnosis not present

## 2020-07-06 ENCOUNTER — Other Ambulatory Visit: Payer: Self-pay | Admitting: Orthopaedic Surgery

## 2020-07-06 MED ORDER — PREDNISONE 10 MG (21) PO TBPK: ORAL_TABLET | ORAL | 0 refills | Status: AC

## 2020-09-04 DIAGNOSIS — L920 Granuloma annulare: Secondary | ICD-10-CM | POA: Diagnosis not present

## 2020-09-04 DIAGNOSIS — Z79899 Other long term (current) drug therapy: Secondary | ICD-10-CM | POA: Diagnosis not present

## 2020-10-23 DIAGNOSIS — Z0000 Encounter for general adult medical examination without abnormal findings: Secondary | ICD-10-CM | POA: Diagnosis not present

## 2020-10-23 DIAGNOSIS — R7989 Other specified abnormal findings of blood chemistry: Secondary | ICD-10-CM | POA: Diagnosis not present

## 2020-10-29 ENCOUNTER — Ambulatory Visit (HOSPITAL_COMMUNITY): Payer: BC Managed Care – PPO | Admitting: Certified Registered"

## 2020-10-29 ENCOUNTER — Encounter (HOSPITAL_COMMUNITY): Payer: Self-pay | Admitting: General Surgery

## 2020-10-29 ENCOUNTER — Other Ambulatory Visit: Payer: Self-pay

## 2020-10-29 ENCOUNTER — Encounter (HOSPITAL_COMMUNITY)
Admission: RE | Disposition: A | Discharge status: Home / Self Care | Payer: Self-pay | Source: Ambulatory Visit | Attending: General Surgery

## 2020-10-29 ENCOUNTER — Ambulatory Visit (HOSPITAL_COMMUNITY)
Admission: RE | Admit: 2020-10-29 | Discharge: 2020-10-29 | Disposition: A | Discharge status: Home / Self Care | Payer: BC Managed Care – PPO | Source: Ambulatory Visit | Attending: General Surgery | Admitting: General Surgery

## 2020-10-29 DIAGNOSIS — R310 Gross hematuria: Secondary | ICD-10-CM | POA: Diagnosis not present

## 2020-10-29 DIAGNOSIS — Z1331 Encounter for screening for depression: Secondary | ICD-10-CM | POA: Diagnosis not present

## 2020-10-29 DIAGNOSIS — Z1339 Encounter for screening examination for other mental health and behavioral disorders: Secondary | ICD-10-CM | POA: Diagnosis not present

## 2020-10-29 DIAGNOSIS — R1031 Right lower quadrant pain: Secondary | ICD-10-CM | POA: Diagnosis not present

## 2020-10-29 DIAGNOSIS — Z20822 Contact with and (suspected) exposure to covid-19: Secondary | ICD-10-CM | POA: Insufficient documentation

## 2020-10-29 DIAGNOSIS — Z0000 Encounter for general adult medical examination without abnormal findings: Secondary | ICD-10-CM | POA: Diagnosis not present

## 2020-10-29 DIAGNOSIS — K37 Unspecified appendicitis: Secondary | ICD-10-CM | POA: Diagnosis not present

## 2020-10-29 DIAGNOSIS — K3580 Unspecified acute appendicitis: Secondary | ICD-10-CM | POA: Insufficient documentation

## 2020-10-29 DIAGNOSIS — R82998 Other abnormal findings in urine: Secondary | ICD-10-CM | POA: Diagnosis not present

## 2020-10-29 HISTORY — PX: LAPAROSCOPIC APPENDECTOMY: SHX408

## 2020-10-29 LAB — SARS CORONAVIRUS 2 BY RT PCR (HOSPITAL ORDER, PERFORMED IN ~~LOC~~ HOSPITAL LAB): SARS Coronavirus 2: NEGATIVE

## 2020-10-29 LAB — POCT PREGNANCY, URINE: Preg Test, Ur: NEGATIVE

## 2020-10-29 SURGERY — APPENDECTOMY, LAPAROSCOPIC
Anesthesia: General | Site: Abdomen

## 2020-10-29 MED ORDER — ACETAMINOPHEN 10 MG/ML IV SOLN
INTRAVENOUS | Status: DC | PRN
  Administered 2020-10-29: 1000 mg via INTRAVENOUS

## 2020-10-29 MED ORDER — FENTANYL CITRATE (PF) 100 MCG/2ML IJ SOLN
25.0000 ug | INTRAMUSCULAR | Status: DC | PRN
Start: 2020-10-29 — End: 2020-10-30

## 2020-10-29 MED ORDER — KETOROLAC TROMETHAMINE 30 MG/ML IJ SOLN
INTRAMUSCULAR | Status: DC | PRN
  Administered 2020-10-29: 30 mg via INTRAVENOUS

## 2020-10-29 MED ORDER — ROCURONIUM BROMIDE 100 MG/10ML IV SOLN
INTRAVENOUS | Status: DC | PRN
  Administered 2020-10-29: 40 mg via INTRAVENOUS

## 2020-10-29 MED ORDER — SODIUM CHLORIDE 0.9 % IR SOLN
Status: DC | PRN
  Administered 2020-10-29: 1000 mL

## 2020-10-29 MED ORDER — SUGAMMADEX SODIUM 200 MG/2ML IV SOLN
INTRAVENOUS | Status: DC | PRN
  Administered 2020-10-29: 125 mg via INTRAVENOUS

## 2020-10-29 MED ORDER — DEXAMETHASONE SODIUM PHOSPHATE 4 MG/ML IJ SOLN
INTRAMUSCULAR | Status: DC | PRN
  Administered 2020-10-29: 4 mg via INTRAVENOUS

## 2020-10-29 MED ORDER — MIDAZOLAM HCL 2 MG/2ML IJ SOLN
INTRAMUSCULAR | Status: AC
  Filled 2020-10-29: qty 2

## 2020-10-29 MED ORDER — PROPOFOL 10 MG/ML IV BOLUS
INTRAVENOUS | Status: AC
  Filled 2020-10-29: qty 20

## 2020-10-29 MED ORDER — CHLORHEXIDINE GLUCONATE 0.12 % MT SOLN
15.0000 mL | Freq: Once | OROMUCOSAL | Status: AC
  Administered 2020-10-29: 15 mL via OROMUCOSAL
  Filled 2020-10-29: qty 15

## 2020-10-29 MED ORDER — BUPIVACAINE HCL (PF) 0.25 % IJ SOLN
INTRAMUSCULAR | Status: AC
  Filled 2020-10-29: qty 30

## 2020-10-29 MED ORDER — MIDAZOLAM HCL 5 MG/5ML IJ SOLN
INTRAMUSCULAR | Status: DC | PRN
  Administered 2020-10-29: 2 mg via INTRAVENOUS

## 2020-10-29 MED ORDER — OXYCODONE HCL 5 MG PO TABS: 5.0000 mg | ORAL_TABLET | Freq: Once | ORAL | Status: DC | PRN

## 2020-10-29 MED ORDER — FENTANYL CITRATE (PF) 250 MCG/5ML IJ SOLN
INTRAMUSCULAR | Status: AC
  Filled 2020-10-29: qty 5

## 2020-10-29 MED ORDER — TRAMADOL HCL 50 MG PO TABS: 50.0000 mg | ORAL_TABLET | Freq: Four times a day (QID) | ORAL | 0 refills | Status: AC | PRN

## 2020-10-29 MED ORDER — LACTATED RINGERS IV SOLN: INTRAVENOUS | Status: DC | PRN

## 2020-10-29 MED ORDER — SODIUM CHLORIDE 0.9 % IV SOLN
2.0000 g | Freq: Once | INTRAVENOUS | Status: AC
  Administered 2020-10-29: 2 g via INTRAVENOUS
  Filled 2020-10-29: qty 20

## 2020-10-29 MED ORDER — ORAL CARE MOUTH RINSE: 15.0000 mL | Freq: Once | OROMUCOSAL | Status: AC

## 2020-10-29 MED ORDER — PROPOFOL 10 MG/ML IV BOLUS
INTRAVENOUS | Status: DC | PRN
  Administered 2020-10-29: 130 mg via INTRAVENOUS

## 2020-10-29 MED ORDER — FENTANYL CITRATE (PF) 100 MCG/2ML IJ SOLN
INTRAMUSCULAR | Status: DC | PRN
  Administered 2020-10-29: 75 ug via INTRAVENOUS
  Administered 2020-10-29: 25 ug via INTRAVENOUS

## 2020-10-29 MED ORDER — LACTATED RINGERS IV SOLN: INTRAVENOUS | Status: DC

## 2020-10-29 MED ORDER — METRONIDAZOLE IN NACL 5-0.79 MG/ML-% IV SOLN
500.0000 mg | Freq: Three times a day (TID) | INTRAVENOUS | Status: DC
  Administered 2020-10-29: 500 mg via INTRAVENOUS
  Filled 2020-10-29: qty 100

## 2020-10-29 MED ORDER — BUPIVACAINE HCL 0.25 % IJ SOLN
INTRAMUSCULAR | Status: DC | PRN
  Administered 2020-10-29: 10 mL

## 2020-10-29 MED ORDER — ONDANSETRON HCL 4 MG/2ML IJ SOLN
INTRAMUSCULAR | Status: DC | PRN
  Administered 2020-10-29: 4 mg via INTRAVENOUS

## 2020-10-29 MED ORDER — 0.9 % SODIUM CHLORIDE (POUR BTL) OPTIME
TOPICAL | Status: DC | PRN
  Administered 2020-10-29: 1000 mL

## 2020-10-29 MED ORDER — OXYCODONE HCL 5 MG/5ML PO SOLN
5.0000 mg | Freq: Once | ORAL | Status: DC | PRN
Start: 2020-10-29 — End: 2020-10-30

## 2020-10-29 MED ORDER — LIDOCAINE HCL (CARDIAC) PF 100 MG/5ML IV SOSY
PREFILLED_SYRINGE | INTRAVENOUS | Status: DC | PRN
  Administered 2020-10-29: 40 mg via INTRAVENOUS

## 2020-10-29 SURGICAL SUPPLY — 41 items
APPLIER CLIP 5 13 M/L LIGAMAX5 (MISCELLANEOUS)
BLADE CLIPPER SURG (BLADE) IMPLANT
CANISTER SUCT 3000ML PPV (MISCELLANEOUS) ×2 IMPLANT
CHLORAPREP W/TINT 26 (MISCELLANEOUS) ×2 IMPLANT
CLIP APPLIE 5 13 M/L LIGAMAX5 (MISCELLANEOUS) IMPLANT
CLIP VESOLOCK XL 6/CT (CLIP) ×2 IMPLANT
COVER SURGICAL LIGHT HANDLE (MISCELLANEOUS) ×2 IMPLANT
COVER TRANSDUCER ULTRASND (DRAPES) ×2 IMPLANT
COVER WAND RF STERILE (DRAPES) ×2 IMPLANT
DERMABOND ADVANCED (GAUZE/BANDAGES/DRESSINGS) ×1
DERMABOND ADVANCED .7 DNX12 (GAUZE/BANDAGES/DRESSINGS) ×1 IMPLANT
ELECT REM PT RETURN 9FT ADLT (ELECTROSURGICAL) ×2
ELECTRODE REM PT RTRN 9FT ADLT (ELECTROSURGICAL) ×1 IMPLANT
ENDOLOOP SUT PDS II  0 18 (SUTURE)
ENDOLOOP SUT PDS II 0 18 (SUTURE) IMPLANT
GLOVE BIO SURGEON STRL SZ7.5 (GLOVE) ×2 IMPLANT
GOWN STRL REUS W/ TWL LRG LVL3 (GOWN DISPOSABLE) ×1 IMPLANT
GOWN STRL REUS W/ TWL XL LVL3 (GOWN DISPOSABLE) ×1 IMPLANT
GOWN STRL REUS W/TWL LRG LVL3 (GOWN DISPOSABLE) ×1
GOWN STRL REUS W/TWL XL LVL3 (GOWN DISPOSABLE) ×1
GRASPER SUT TROCAR 14GX15 (MISCELLANEOUS) ×2 IMPLANT
KIT BASIN OR (CUSTOM PROCEDURE TRAY) ×2 IMPLANT
KIT TURNOVER KIT B (KITS) ×2 IMPLANT
NEEDLE INSUFFLATION 14GA 120MM (NEEDLE) ×2 IMPLANT
NS IRRIG 1000ML POUR BTL (IV SOLUTION) ×2 IMPLANT
PAD ARMBOARD 7.5X6 YLW CONV (MISCELLANEOUS) ×4 IMPLANT
SCISSORS LAP 5X35 DISP (ENDOMECHANICALS) ×2 IMPLANT
SET IRRIG TUBING LAPAROSCOPIC (IRRIGATION / IRRIGATOR) ×2 IMPLANT
SET TUBE SMOKE EVAC HIGH FLOW (TUBING) ×2 IMPLANT
SLEEVE ENDOPATH XCEL 5M (ENDOMECHANICALS) ×2 IMPLANT
SPECIMEN JAR SMALL (MISCELLANEOUS) ×2 IMPLANT
SUT MNCRL AB 4-0 PS2 18 (SUTURE) ×2 IMPLANT
TOWEL GREEN STERILE (TOWEL DISPOSABLE) ×2 IMPLANT
TOWEL GREEN STERILE FF (TOWEL DISPOSABLE) ×2 IMPLANT
TRAY FOLEY W/BAG SLVR 16FR (SET/KITS/TRAYS/PACK)
TRAY FOLEY W/BAG SLVR 16FR ST (SET/KITS/TRAYS/PACK) IMPLANT
TRAY LAPAROSCOPIC MC (CUSTOM PROCEDURE TRAY) ×2 IMPLANT
TROCAR XCEL NON-BLD 11X100MML (ENDOMECHANICALS) ×2 IMPLANT
TROCAR XCEL NON-BLD 5MMX100MML (ENDOMECHANICALS) ×2 IMPLANT
WARMER LAPAROSCOPE (MISCELLANEOUS) ×2 IMPLANT
WATER STERILE IRR 1000ML POUR (IV SOLUTION) ×2 IMPLANT

## 2020-10-29 NOTE — Op Note (Signed)
10/29/2020  8:36 PM  PATIENT:  Dawn Maynard  40 y.o. female  PRE-OPERATIVE DIAGNOSIS:  appendicitis  POST-OPERATIVE DIAGNOSIS: acute appendicitis  PROCEDURE:  Procedure(s): APPENDECTOMY LAPAROSCOPIC (N/A)  SURGEON:  Surgeon(s) and Role:    * Axel Filler, MD - Primary   ANESTHESIA:   local and general  EBL:  minimal   BLOOD ADMINISTERED:none  DRAINS: none   LOCAL MEDICATIONS USED:  BUPIVICAINE   SPECIMEN:  Source of Specimen:  appendix  DISPOSITION OF SPECIMEN:  PATHOLOGY  COUNTS:  YES  TOURNIQUET:  * No tourniquets in log *  DICTATION: .Dragon Dictation Complications: none  Counts: reported as correct x 2  Findings:  The patient had a acutely inflamed, non perforated appendix  Specimen: Appendix  Indications for procedure:  The patient is a 40 year old female with a history of periumbilical pain localized in the right lower quadrant patient had a CT scan which revealed signs consistent with acute appendicitis the patient back in for laparoscopic appendectomy.  Details of the procedure:The patient was taken back to the operating room. The patient was placed in supine position with bilateral SCDs in place. The patient was prepped and draped in the usual sterile fashion.  After appropriate anitbiotics were confirmed, a time-out was confirmed and all facts were verified.    A pneumoperitoneum of 14 mmHg was obtained via a Veress needle technique in the left lower quadrant quadrant.  A 5 mm trocar and 5 mm camera then placed intra-abdominally there is no injury to any intra-abdominal organs a 10 mm infraumbilical port was placed and direct visualization as was a 5 mm port in the suprapubic area.   The appendix was identified and seen to be non-perforated.  The appendix was cleaned down to the appendiceal base. The mesoappendix was then incised and the appendiceal artery was cauterized.  The the appendiceal base was clean.  A gold hemoclip was placed proximallyx2  and one distally and the appendix was transected between these 2. A retrieval bag was then placed into the abdomen and the specimen placed in the bag. The appendiceal stump was cauterized. We evacuate the fluid from the pelvis until the effluent was clear.  The appendix and retrieval  bag was then retrieved via the supraumbilical port. #1 Vicryl was used to reapproximate the fascia at the umbilical port site x2. The skin was reapproximated all port sites 3-0 Monocryl subcuticular fashion. The skin was dressed with Dermabond.   The patient was awakened from general anesthesia was taken to recovery room in stable condition.      PLAN OF CARE: Discharge to home after PACU  PATIENT DISPOSITION:  PACU - hemodynamically stable.   Delay start of Pharmacological VTE agent (>24hrs) due to surgical blood loss or risk of bleeding: not applicable

## 2020-10-29 NOTE — Anesthesia Procedure Notes (Signed)
Procedure Name: Intubation Date/Time: 10/29/2020 7:59 PM Performed by: Oletta Lamas, CRNA Pre-anesthesia Checklist: Patient identified, Emergency Drugs available, Suction available and Patient being monitored Patient Re-evaluated:Patient Re-evaluated prior to induction Oxygen Delivery Method: Circle System Utilized Preoxygenation: Pre-oxygenation with 100% oxygen Induction Type: IV induction Ventilation: Mask ventilation without difficulty Laryngoscope Size: Mac and 3 Grade View: Grade I Tube type: Oral Tube size: 7.0 mm Number of attempts: 1 Airway Equipment and Method: Stylet Placement Confirmation: ETT inserted through vocal cords under direct vision,  positive ETCO2 and breath sounds checked- equal and bilateral Secured at: 22 cm Tube secured with: Tape Dental Injury: Teeth and Oropharynx as per pre-operative assessment

## 2020-10-29 NOTE — H&P (Signed)
Dawn Maynard is an 40 y.o. female.   Chief Complaint: Abdominal pain HPI: Patient is a 40 year old female, otherwise healthy, who comes in secondary to abdominal pain.  She states that this been less than 24 hours.  She states that patient had significant pain discomfort after a run this morning.  She states that the pain is been periumbilical.  She states that soon thereafter in conjunction with her menstrual cycle she had some blood in her urine.  Patient followed up with her PCP.  Secondary to the above findings patient underwent CT scan.  CT scan was significant for early acute appendicitis.  Patient was then brought in for laparoscopic appendectomy.  Patient denies any emesis or fever.  Patient does state she has some nausea and diarrhea.  I did review the CT scan images personally.  History reviewed. No pertinent past medical history.  Past Surgical History:  Procedure Laterality Date  . BLADDER SUSPENSION    . BREAST SURGERY    . fasciotomy    . HAMSTRING AVULSION REPAIR    . WISDOM TOOTH EXTRACTION      History reviewed. No pertinent family history. Social History:  reports that she has never smoked. She has never used smokeless tobacco. She reports current alcohol use of about 1.0 - 3.0 standard drink of alcohol per week. She reports that she does not use drugs.  Allergies: No Known Allergies  Medications Prior to Admission  Medication Sig Dispense Refill  . betamethasone dipropionate (DIPROLENE) 0.05 % cream   1  . clobetasol (TEMOVATE) 0.05 % external solution APP AA BID X2 WEEKS  1  . ketoconazole (NIZORAL) 2 % shampoo APP ON THE SCALP 2 TO 3 TIMES WEEKLY UTD  0  . Betamethasone Valerate 0.12 % foam   3  . celecoxib (CELEBREX) 200 MG capsule Take 1 capsule (200 mg total) by mouth 2 (two) times daily. (Patient not taking: No sig reported) 60 capsule 3  . diclofenac (FLECTOR) 1.3 % PTCH CUT THE PATCH DOWN TO APPROPRIATE SIZE AND APPLY TO TO THE AFFECTED AREA EVERY 12  HOURS 30 patch 11  . Diclofenac Sodium (PENNSAID) 2 % SOLN Apply 2 g topically 2 (two) times daily as needed (to affected area). 112 g 3  . JUNEL 1/20 1-20 MG-MCG tablet   0  . methylPREDNISolone (MEDROL DOSEPAK) 4 MG TBPK tablet Use as directed (Patient not taking: Reported on 04/15/2018) 21 tablet 0  . nitroGLYCERIN (NITRODUR - DOSED IN MG/24 HR) 0.2 mg/hr patch   1  . nitroGLYCERIN (NITRODUR - DOSED IN MG/24 HR) 0.2 mg/hr patch UNWRAP AND APPLY 1/4 PATCH TO THE AFFECTED AREA EVERY 24 HOURS( ROTATE SITES TO AVOID SKIN IRRITATION) DO NOT TOUCH EYES AFTER HANDLING PATCH 30 patch 12  . predniSONE (DELTASONE) 10 MG tablet Take as directed for 12 days.  Daily dose 6,6,5,5,4,4,3,3,2,2,1,1. 42 tablet 0  . predniSONE (STERAPRED UNI-PAK 21 TAB) 10 MG (21) TBPK tablet Take as directed 21 tablet 0  . predniSONE (STERAPRED UNI-PAK 21 TAB) 10 MG (21) TBPK tablet Take as directed 21 tablet 0  . predniSONE (STERAPRED UNI-PAK 21 TAB) 10 MG (21) TBPK tablet Take as directed 21 tablet 0  . traMADol (ULTRAM) 50 MG tablet Take 1-2 tablets (50-100 mg total) by mouth every 6 (six) hours as needed. 30 tablet 0  . tretinoin (RETIN-A) 0.05 % cream APPLY ON THE SKIN DAILY. APPLY DAILY TO FACE  HS  2  . XULANE 150-35 MCG/24HR transdermal patch APP 1 PA  EXT TO THE SKIN WEEKLY UTD  11    Results for orders placed or performed during the hospital encounter of 10/29/20 (from the past 48 hour(s))  Pregnancy, urine POC     Status: None   Collection Time: 10/29/20  6:36 PM  Result Value Ref Range   Preg Test, Ur NEGATIVE NEGATIVE    Comment:        THE SENSITIVITY OF THIS METHODOLOGY IS >24 mIU/mL    No results found.  Review of Systems  Constitutional: Negative for chills and fever.  HENT: Negative for ear discharge, hearing loss and sore throat.   Eyes: Negative for discharge.  Respiratory: Negative for cough and shortness of breath.   Cardiovascular: Negative for chest pain and leg swelling.  Gastrointestinal:  Positive for abdominal pain. Negative for constipation, diarrhea, nausea and vomiting.  Genitourinary: Positive for hematuria.  Musculoskeletal: Negative for myalgias and neck pain.  Skin: Negative for rash.  Allergic/Immunologic: Negative for environmental allergies.  Neurological: Negative for dizziness and seizures.  Hematological: Does not bruise/bleed easily.  Psychiatric/Behavioral: Negative for suicidal ideas.  All other systems reviewed and are negative.   Blood pressure (!) 141/96, temperature 98.4 F (36.9 C), temperature source Oral, resp. rate 18, last menstrual period 10/28/2020. Physical Exam Constitutional:      Appearance: She is well-developed.     Comments: Conversant No acute distress  Eyes:     General: Lids are normal. No scleral icterus.    Comments: Pupils are equal round and reactive No lid lag Moist conjunctiva  Neck:     Thyroid: No thyromegaly.     Trachea: No tracheal tenderness.     Comments: No cervical lymphadenopathy Cardiovascular:     Rate and Rhythm: Normal rate and regular rhythm.     Heart sounds: No murmur heard.   Pulmonary:     Effort: Pulmonary effort is normal.     Breath sounds: Normal breath sounds. No wheezing or rales.  Abdominal:     General: Abdomen is flat. Bowel sounds are normal.     Palpations: Abdomen is soft.     Tenderness: There is abdominal tenderness in the right lower quadrant and periumbilical area. There is no guarding or rebound.     Hernia: No hernia is present.  Skin:    General: Skin is warm.     Findings: No rash.     Nails: There is no clubbing.     Comments: Normal skin turgor  Neurological:     Mental Status: She is alert and oriented to person, place, and time.     Comments: Normal gait and station  Psychiatric:        Judgment: Judgment normal.     Comments: Appropriate affect      Assessment/Plan 40 year old female with likely early acute appendicitis. 1.  We will proceed to the operating  for laparoscopic appendectomy 2. I discussed with the patient the risks benefits of the procedure to include but not limited to: Infection, bleeding, damage to surrounding structures, possible ileus, possible postoperative infection. Patient voiced understanding and wishes to proceed.    Axel Filler, MD 10/29/2020, 7:10 PM

## 2020-10-29 NOTE — Discharge Instructions (Signed)
PATIENT INSTRUCTIONS  APPENDICITIS    FOLLOW-UP:  Please make an appointment with your physician in 2 week(s).  Call your physician immediately if you have any fevers greater than 102.5, drainage from your wound that is not clear or looks infected, persistent bleeding, increasing abdominal pain, problems urinating, or persistent nausea/vomiting.      WOUND CARE INSTRUCTIONS:  Keep a dry clean dressing on the wound if there is drainage. The initial bandage may be removed after 24 hours.  Once the wound has quit draining you may leave it open to air.  If clothing rubs against the wound or causes irritation and the wound is not draining you may cover it with a dry dressing during the daytime.  Try to keep the wound dry and avoid ointments on the wound unless directed to do so.  If the wound becomes bright red and painful or starts to drain infected material that is not clear, please contact your physician immediately. You should also call immediately if you start to drain large amounts of fluid from the wound, requiring you to change the bandage frequently.   If the wound though is mildly pink and has a thick firm ridge underneath it, this is normal, and is referred to as a healing ridge.  This will resolve over the next 4-6 weeks.    DIET:  You may eat any foods that you can tolerate.  It is a good idea to eat a high fiber diet and take in plenty of fluids to prevent constipation.  If you do become constipated you may want to take a mild laxative or take ducolax tablets on a daily basis until your bowel habits are regular.  Constipation can be very uncomfortable, along with straining, after recent abdominal surgery.    ACTIVITY:  You are encouraged to cough and deep breath or use your incentive spirometer if you were given one, every 15-30 minutes when awake.  This will help prevent respiratory complications and low grade fevers post-operatively.  You may want to hug a pillow when coughing and sneezing to add  additional support to the surgical area which will decrease pain during these times.  You are encouraged to walk and engage in light activity for the next two weeks.  You should not lift more than 20 pounds during this time frame as it could put you at increased risk for a post-operative hernia.  Twenty pounds is roughly equivalent to a plastic bag of groceries.      MEDICATIONS:  Try to take narcotic medications and anti-inflammatory medications, such as tylenol, ibuprofen, naprosyn, etc., with food.  This will minimize stomach upset from the medication.  Should you develop nausea and vomiting from the pain medication, or develop a rash, please discontinue the medication and contact your physician.  You should not drive, make important decisions, or operate machinery when taking narcotic pain medication.    QUESTIONS:  Please feel free to call your physician or the hospital operator if you have any questions, and they will be glad to assist you.

## 2020-10-29 NOTE — Transfer of Care (Signed)
Immediate Anesthesia Transfer of Care Note  Patient: Namon Cirri  Procedure(s) Performed: APPENDECTOMY LAPAROSCOPIC (N/A Abdomen)  Patient Location: PACU  Anesthesia Type:General  Level of Consciousness: awake, alert , oriented and patient cooperative  Airway & Oxygen Therapy: Patient Spontanous Breathing  Post-op Assessment: Report given to RN and Post -op Vital signs reviewed and stable  Post vital signs: Reviewed and stable  Last Vitals:  Vitals Value Taken Time  BP    Temp    Pulse 93 10/29/20 2041  Resp 17 10/29/20 2041  SpO2 100 % 10/29/20 2041  Vitals shown include unvalidated device data.  Last Pain:  Vitals:   10/29/20 1856  TempSrc: Oral  PainSc:       Patients Stated Pain Goal: 5 (10/29/20 1848)  Complications: No complications documented.

## 2020-10-29 NOTE — Anesthesia Preprocedure Evaluation (Addendum)
Anesthesia Evaluation  Patient identified by MRN, date of birth, ID band Patient awake    Reviewed: Allergy & Precautions, NPO status , Patient's Chart, lab work & pertinent test results  History of Anesthesia Complications Negative for: history of anesthetic complications  Airway Mallampati: I  TM Distance: >3 FB Neck ROM: Full    Dental  (+) Dental Advisory Given, Teeth Intact   Pulmonary neg pulmonary ROS, neg recent URI,  Covid-19 Nucleic Acid Test Results Lab Results      Component                Value               Date                      SARSCOV2NAA              NEGATIVE            10/29/2020              breath sounds clear to auscultation       Cardiovascular negative cardio ROS   Rhythm:Regular     Neuro/Psych negative neurological ROS  negative psych ROS   GI/Hepatic Neg liver ROS, appendicitis   Endo/Other  negative endocrine ROS  Renal/GU negative Renal ROS     Musculoskeletal negative musculoskeletal ROS (+)   Abdominal   Peds  Hematology negative hematology ROS (+)   Anesthesia Other Findings   Reproductive/Obstetrics                            Anesthesia Physical Anesthesia Plan  ASA: I  Anesthesia Plan: General   Post-op Pain Management:    Induction: Intravenous, Rapid sequence and Cricoid pressure planned  PONV Risk Score and Plan: 3 and Ondansetron and Dexamethasone  Airway Management Planned: Oral ETT  Additional Equipment: None  Intra-op Plan:   Post-operative Plan: Extubation in OR  Informed Consent: I have reviewed the patients History and Physical, chart, labs and discussed the procedure including the risks, benefits and alternatives for the proposed anesthesia with the patient or authorized representative who has indicated his/her understanding and acceptance.     Dental advisory given  Plan Discussed with: CRNA and Surgeon  Anesthesia  Plan Comments:         Anesthesia Quick Evaluation

## 2020-10-30 ENCOUNTER — Encounter (HOSPITAL_COMMUNITY): Payer: Self-pay | Admitting: General Surgery

## 2020-10-30 NOTE — Anesthesia Postprocedure Evaluation (Signed)
Anesthesia Post Note  Patient: Dawn Maynard  Procedure(s) Performed: APPENDECTOMY LAPAROSCOPIC (N/A Abdomen)     Patient location during evaluation: PACU Anesthesia Type: General Level of consciousness: awake and alert Pain management: pain level controlled Vital Signs Assessment: post-procedure vital signs reviewed and stable Respiratory status: spontaneous breathing, nonlabored ventilation, respiratory function stable and patient connected to nasal cannula oxygen Cardiovascular status: blood pressure returned to baseline and stable Postop Assessment: no apparent nausea or vomiting Anesthetic complications: no   No complications documented.  Last Vitals:  Vitals:   10/29/20 2056 10/29/20 2108  BP: 127/79 127/85  Pulse: 74 72  Resp: 13 14  Temp:  (!) 36.2 C  SpO2: 100% 100%    Last Pain:  Vitals:   10/29/20 2108  TempSrc:   PainSc: 0-No pain                 Rei Medlen

## 2020-10-31 LAB — SURGICAL PATHOLOGY

## 2020-11-18 DIAGNOSIS — N390 Urinary tract infection, site not specified: Secondary | ICD-10-CM | POA: Diagnosis not present

## 2020-11-20 DIAGNOSIS — L920 Granuloma annulare: Secondary | ICD-10-CM | POA: Diagnosis not present

## 2020-11-26 DIAGNOSIS — L920 Granuloma annulare: Secondary | ICD-10-CM | POA: Diagnosis not present

## 2020-11-26 DIAGNOSIS — Z79899 Other long term (current) drug therapy: Secondary | ICD-10-CM | POA: Diagnosis not present

## 2020-11-28 DIAGNOSIS — L920 Granuloma annulare: Secondary | ICD-10-CM | POA: Diagnosis not present

## 2020-12-02 DIAGNOSIS — L920 Granuloma annulare: Secondary | ICD-10-CM | POA: Diagnosis not present

## 2020-12-05 DIAGNOSIS — L920 Granuloma annulare: Secondary | ICD-10-CM | POA: Diagnosis not present

## 2020-12-10 DIAGNOSIS — L920 Granuloma annulare: Secondary | ICD-10-CM | POA: Diagnosis not present

## 2020-12-13 DIAGNOSIS — L920 Granuloma annulare: Secondary | ICD-10-CM | POA: Diagnosis not present

## 2020-12-17 DIAGNOSIS — L920 Granuloma annulare: Secondary | ICD-10-CM | POA: Diagnosis not present

## 2020-12-30 DIAGNOSIS — L920 Granuloma annulare: Secondary | ICD-10-CM | POA: Diagnosis not present

## 2021-01-02 DIAGNOSIS — L920 Granuloma annulare: Secondary | ICD-10-CM | POA: Diagnosis not present

## 2021-01-14 DIAGNOSIS — L920 Granuloma annulare: Secondary | ICD-10-CM | POA: Diagnosis not present

## 2021-01-17 DIAGNOSIS — L920 Granuloma annulare: Secondary | ICD-10-CM | POA: Diagnosis not present

## 2021-02-05 DIAGNOSIS — L920 Granuloma annulare: Secondary | ICD-10-CM | POA: Diagnosis not present

## 2021-02-10 DIAGNOSIS — R310 Gross hematuria: Secondary | ICD-10-CM | POA: Diagnosis not present

## 2021-02-11 DIAGNOSIS — L920 Granuloma annulare: Secondary | ICD-10-CM | POA: Diagnosis not present

## 2021-02-13 DIAGNOSIS — L920 Granuloma annulare: Secondary | ICD-10-CM | POA: Diagnosis not present

## 2021-02-18 DIAGNOSIS — R310 Gross hematuria: Secondary | ICD-10-CM | POA: Diagnosis not present

## 2021-02-18 DIAGNOSIS — L920 Granuloma annulare: Secondary | ICD-10-CM | POA: Diagnosis not present

## 2021-02-20 DIAGNOSIS — L920 Granuloma annulare: Secondary | ICD-10-CM | POA: Diagnosis not present

## 2021-03-11 DIAGNOSIS — L920 Granuloma annulare: Secondary | ICD-10-CM | POA: Diagnosis not present

## 2021-03-18 DIAGNOSIS — L920 Granuloma annulare: Secondary | ICD-10-CM | POA: Diagnosis not present

## 2021-03-20 ENCOUNTER — Other Ambulatory Visit: Payer: Self-pay | Admitting: Orthopaedic Surgery

## 2021-03-20 ENCOUNTER — Telehealth: Payer: Self-pay | Admitting: Orthopaedic Surgery

## 2021-03-20 DIAGNOSIS — L920 Granuloma annulare: Secondary | ICD-10-CM | POA: Diagnosis not present

## 2021-03-20 MED ORDER — LIDOCAINE 4 % EX PTCH: MEDICATED_PATCH | CUTANEOUS | 3 refills | Status: AC

## 2021-03-20 MED ORDER — PREDNISONE 10 MG (21) PO TBPK: ORAL_TABLET | ORAL | 0 refills | Status: AC

## 2021-03-20 NOTE — Telephone Encounter (Signed)
Evaluated Dawn Maynard tonight regarding back pain that she's had for 6 weeks without injury or radic.  Pain is point tender to the L2-3 region spinous process.  Pain is worse with extension.  No tenderness to SI joints or hips.  No paraspinous muscle pain or symptoms.  Recommended 800 mg advil with pepcid AC TID, heat, rest, lidocaine patch.

## 2021-03-21 ENCOUNTER — Ambulatory Visit: Payer: BC Managed Care – PPO | Admitting: Orthopaedic Surgery

## 2021-06-04 DIAGNOSIS — M791 Myalgia, unspecified site: Secondary | ICD-10-CM | POA: Diagnosis not present

## 2021-06-04 DIAGNOSIS — L920 Granuloma annulare: Secondary | ICD-10-CM | POA: Diagnosis not present

## 2021-06-04 DIAGNOSIS — R5383 Other fatigue: Secondary | ICD-10-CM | POA: Diagnosis not present

## 2021-06-04 DIAGNOSIS — Z23 Encounter for immunization: Secondary | ICD-10-CM | POA: Diagnosis not present

## 2021-08-07 DIAGNOSIS — M791 Myalgia, unspecified site: Secondary | ICD-10-CM | POA: Diagnosis not present

## 2021-09-16 DIAGNOSIS — L920 Granuloma annulare: Secondary | ICD-10-CM | POA: Diagnosis not present

## 2021-10-27 DIAGNOSIS — L920 Granuloma annulare: Secondary | ICD-10-CM | POA: Diagnosis not present

## 2021-11-10 DIAGNOSIS — Z0000 Encounter for general adult medical examination without abnormal findings: Secondary | ICD-10-CM | POA: Diagnosis not present

## 2021-11-10 DIAGNOSIS — R7989 Other specified abnormal findings of blood chemistry: Secondary | ICD-10-CM | POA: Diagnosis not present

## 2021-11-13 DIAGNOSIS — F341 Dysthymic disorder: Secondary | ICD-10-CM | POA: Diagnosis not present

## 2021-11-13 DIAGNOSIS — Z0000 Encounter for general adult medical examination without abnormal findings: Secondary | ICD-10-CM | POA: Diagnosis not present

## 2021-11-13 DIAGNOSIS — Z1331 Encounter for screening for depression: Secondary | ICD-10-CM | POA: Diagnosis not present

## 2021-11-13 DIAGNOSIS — Z1339 Encounter for screening examination for other mental health and behavioral disorders: Secondary | ICD-10-CM | POA: Diagnosis not present

## 2021-11-18 DIAGNOSIS — Z681 Body mass index (BMI) 19 or less, adult: Secondary | ICD-10-CM | POA: Diagnosis not present

## 2021-11-18 DIAGNOSIS — Z01419 Encounter for gynecological examination (general) (routine) without abnormal findings: Secondary | ICD-10-CM | POA: Diagnosis not present

## 2021-11-18 DIAGNOSIS — Z1151 Encounter for screening for human papillomavirus (HPV): Secondary | ICD-10-CM | POA: Diagnosis not present

## 2021-11-18 DIAGNOSIS — Z124 Encounter for screening for malignant neoplasm of cervix: Secondary | ICD-10-CM | POA: Diagnosis not present

## 2022-02-16 DIAGNOSIS — Z79899 Other long term (current) drug therapy: Secondary | ICD-10-CM | POA: Diagnosis not present

## 2022-04-15 DIAGNOSIS — Z5181 Encounter for therapeutic drug level monitoring: Secondary | ICD-10-CM | POA: Diagnosis not present

## 2022-04-15 DIAGNOSIS — L409 Psoriasis, unspecified: Secondary | ICD-10-CM | POA: Diagnosis not present

## 2022-04-15 DIAGNOSIS — L920 Granuloma annulare: Secondary | ICD-10-CM | POA: Diagnosis not present

## 2022-05-16 IMAGING — MR MR ANKLE*R* W/O CM
4 of 5 series · 17 of 40 positions shown · non-contrast
Comparison: MRI right ankle dated May 30, 2019. Right ankle
x-rays dated May 25, 2019.

CLINICAL DATA: Right heel pain for the past 6 months. No injury or
prior surgery.

EXAM:
MRI OF THE RIGHT ANKLE WITHOUT CONTRAST
TECHNIQUE: Multiplanar, multisequence MR imaging of the ankle was performed. No
intravenous contrast was administered.

[Series 3: T1 · sagittal · 4.0mm · 0.21mm/px · 3 of 21 slices shown]
[im 1/21]
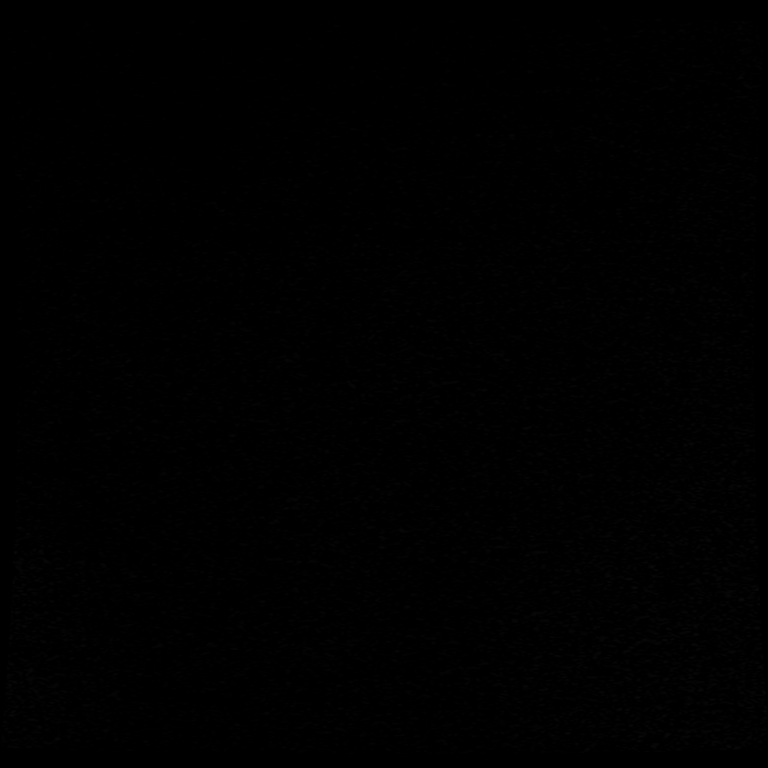
[im 11/21]
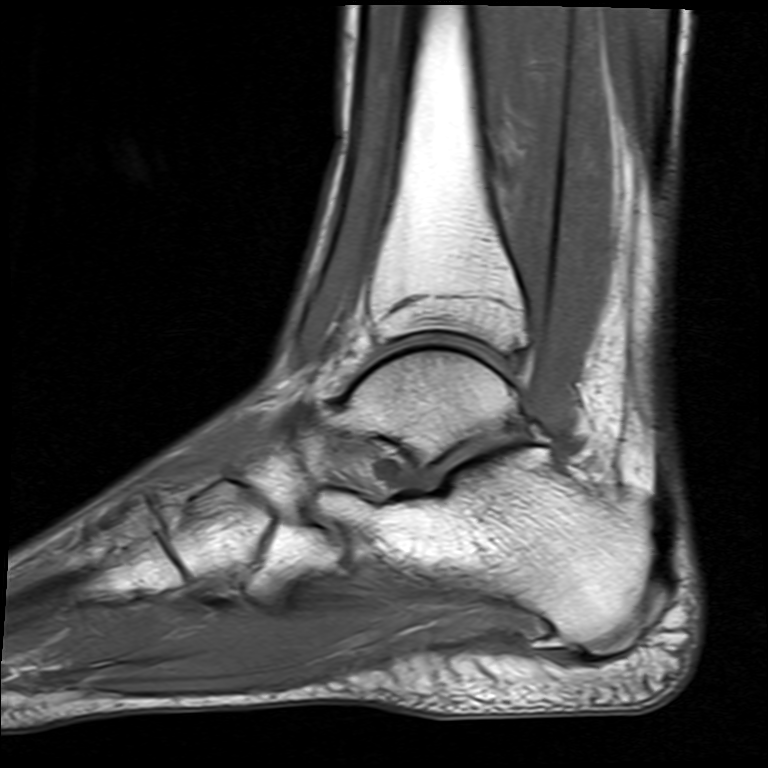
[im 21/21]
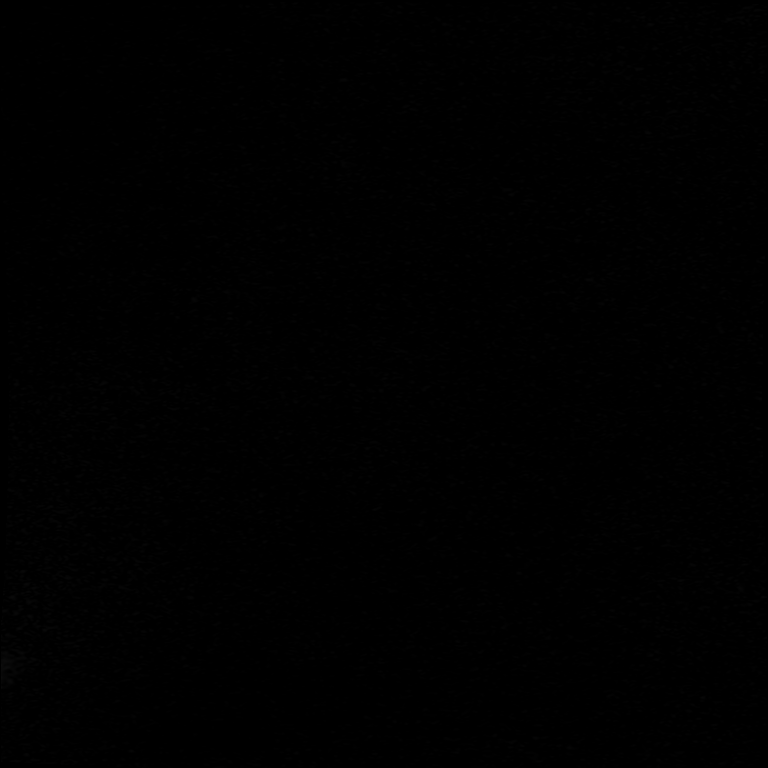

[Series 5: T2 fat-sat · axial · 3.0mm · 0.50mm/px · z∈[-31,+80]mm · 3 of 36 slices shown (1 of 2)]
[im 4/36]
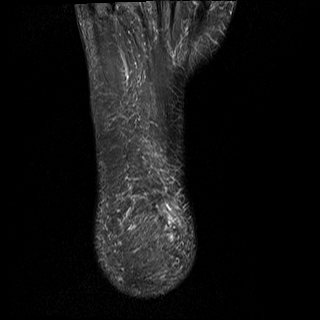
[im 20/36]
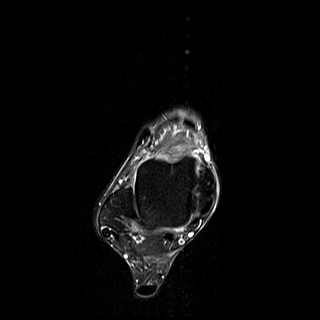
[im 32/36]
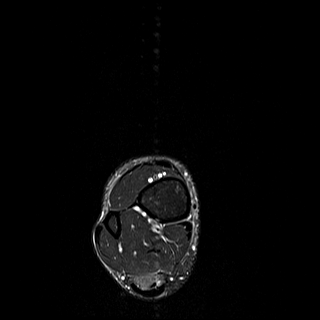

[Series 6: PD fat-sat · axial · 3.0mm · 0.31mm/px · z∈[-43,+96]mm · 8 of 36 slices shown]
[im 1/36]
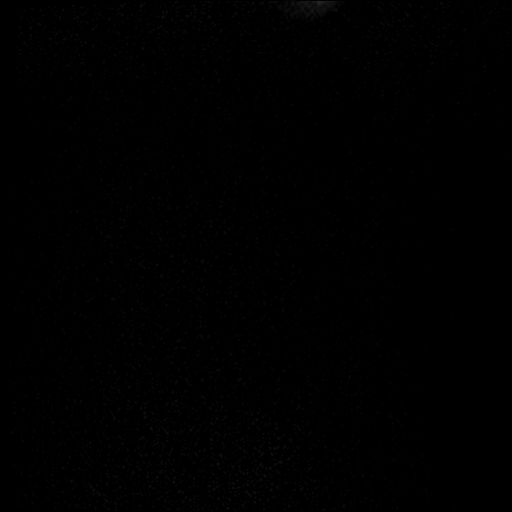
[im 4/36]
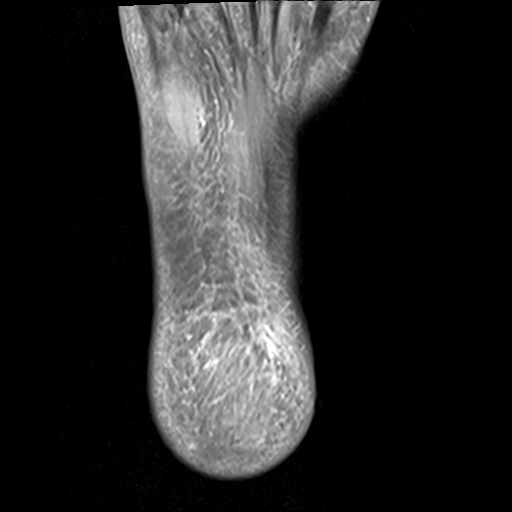
[im 12/36]
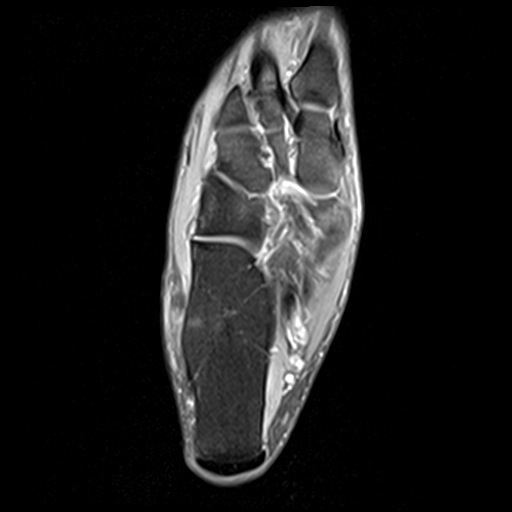
[im 16/36]
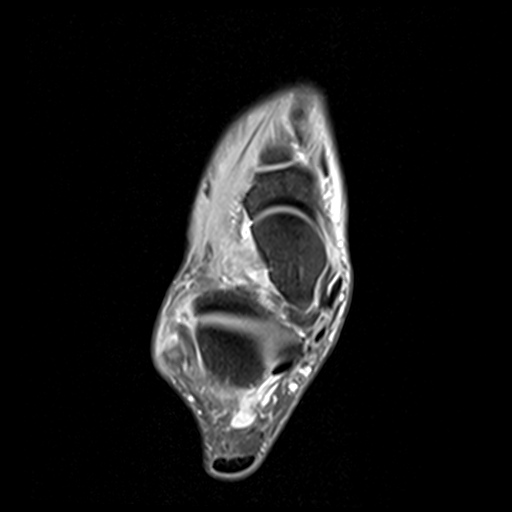
[im 20/36]
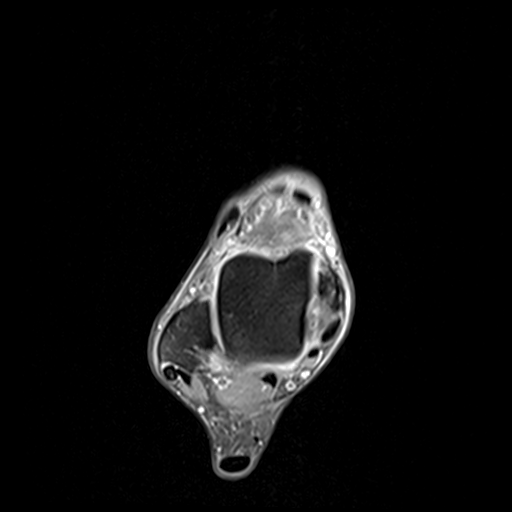
[im 24/36]
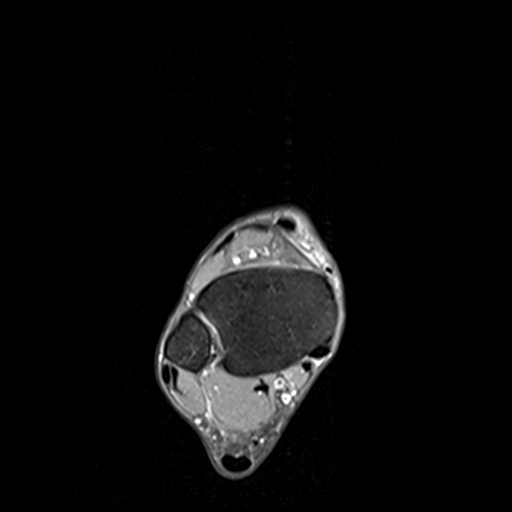
[im 32/36]
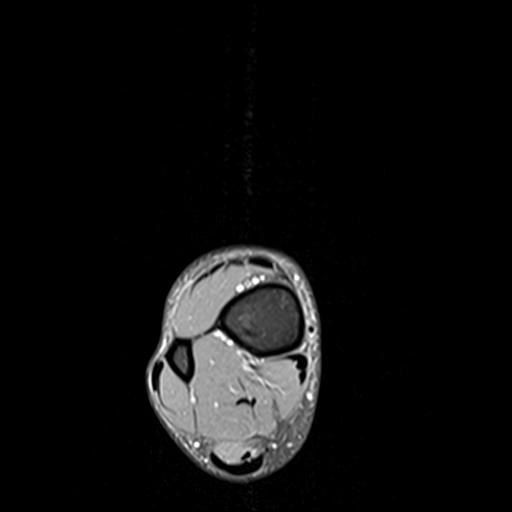
[im 36/36]
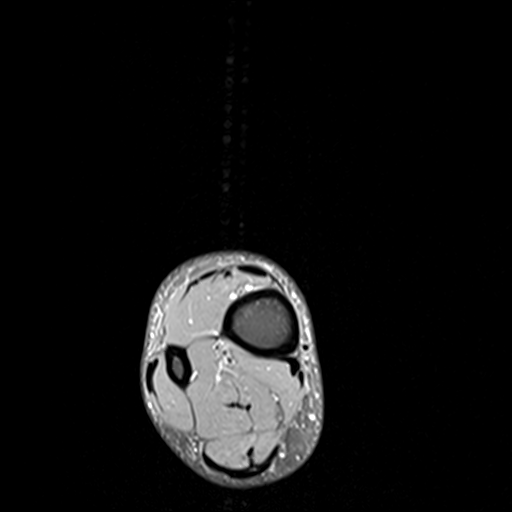

[Series 7: T2 fat-sat · coronal · 3.0mm · 0.29mm/px · 3 of 36 slices shown (2 of 2)]
[im 4/36]
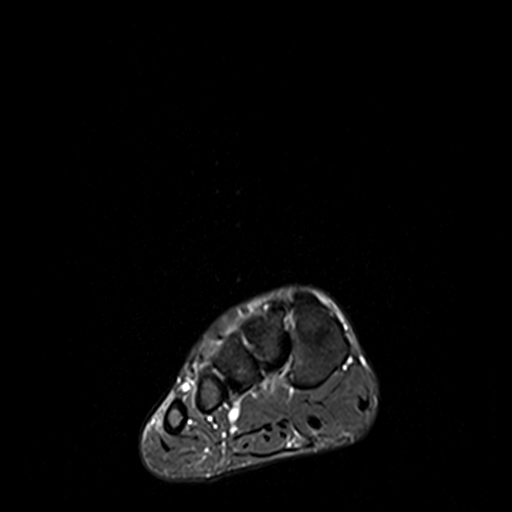
[im 20/36]
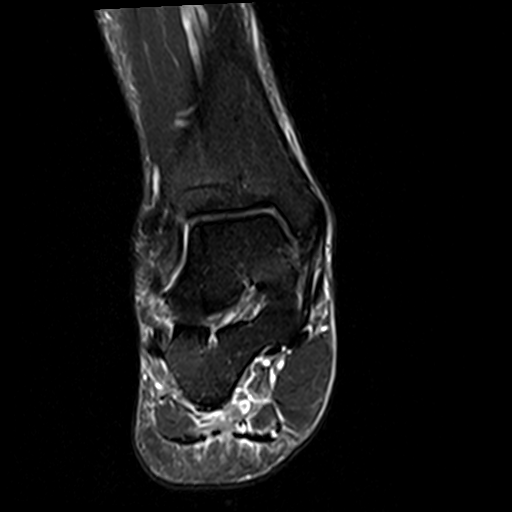
[im 32/36]
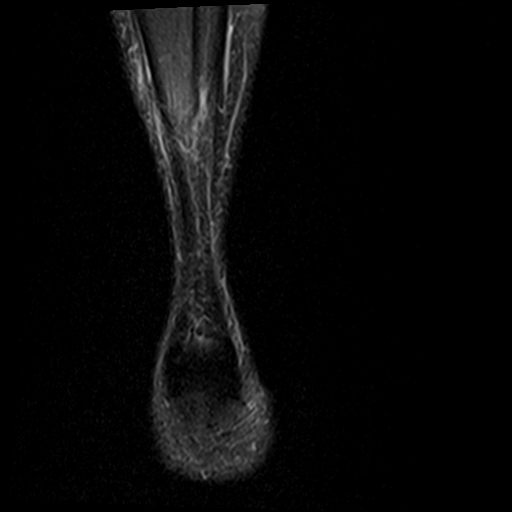

[17 of 40 positions shown; findings below may reference images not displayed]

FINDINGS: TENDONS

Peroneal: New mild peroneal longus tendinosis with tiny
intrasubstance tear. New small amount of fluid in the tendon sheath.
Peroneal brevis intact with new small amount of fluid in the tendon
sheath.

Posteromedial: Posterior tibial tendon intact with unchanged small
amount of fluid in the tendon sheath. Flexor hallucis longus tendon
intact. Flexor digitorum longus tendon intact.

Anterior: Tibialis anterior tendon intact. Extensor hallucis longus
tendon intact Extensor digitorum longus tendon intact.

Achilles:  Intact.

Plantar Fascia: Progressive thickening and increased intermediate
signal within the proximal central band with new near complete tear.
New thickening and increased intermediate signal within the proximal
lateral band with focal high-grade partial tear. Prominent soft
tissue swelling surrounding the proximal plantar fascia.

LIGAMENTS

Lateral: Anterior talofibular ligament intact. Calcaneofibular
ligament intact. Posterior talofibular ligament intact. Anterior and
posterior tibiofibular ligaments intact.

Medial: Deltoid ligament intact. Spring ligament intact.

CARTILAGE

Ankle Joint: No joint effusion. Normal ankle mortise. No chondral
defect.

Subtalar Joints/Sinus Tarsi: Normal subtalar joints. No subtalar
joint effusion. Unchanged edema and 9 mm ganglion cyst within the
sinus tarsi.

Bones: Os trigonum again noted with new trace degenerative marrow
edema in the adjacent posterior talus. No fracture or dislocation.

Soft Tissue: No soft tissue mass or fluid collection.
IMPRESSION: 1. Progressive plantar fasciitis with new near complete tear of the
proximal central band and focal high-grade partial tear of the
proximal lateral band.
2. New mild peroneal longus tendinosis with tiny intrasubstance
tear. New mild peroneal longus and brevis tenosynovitis.
3. Unchanged mild posterior tibial tenosynovitis.
4. Unchanged edema and 9 mm ganglion cyst within the sinus tarsi.
Correlate for sinus tarsi syndrome.

## 2022-08-27 ENCOUNTER — Other Ambulatory Visit: Payer: Self-pay | Admitting: Orthopaedic Surgery

## 2022-08-27 MED ORDER — CELECOXIB 200 MG PO CAPS: 200.0000 mg | ORAL_CAPSULE | Freq: Two times a day (BID) | ORAL | 3 refills | Status: AC

## 2022-11-09 DIAGNOSIS — R5383 Other fatigue: Secondary | ICD-10-CM | POA: Diagnosis not present

## 2022-11-09 DIAGNOSIS — R7989 Other specified abnormal findings of blood chemistry: Secondary | ICD-10-CM | POA: Diagnosis not present

## 2022-11-17 DIAGNOSIS — Z0000 Encounter for general adult medical examination without abnormal findings: Secondary | ICD-10-CM | POA: Diagnosis not present

## 2022-11-17 DIAGNOSIS — Z1331 Encounter for screening for depression: Secondary | ICD-10-CM | POA: Diagnosis not present

## 2022-11-17 DIAGNOSIS — R310 Gross hematuria: Secondary | ICD-10-CM | POA: Diagnosis not present

## 2022-11-17 DIAGNOSIS — L920 Granuloma annulare: Secondary | ICD-10-CM | POA: Diagnosis not present

## 2022-11-17 DIAGNOSIS — M791 Myalgia, unspecified site: Secondary | ICD-10-CM | POA: Diagnosis not present

## 2022-11-17 DIAGNOSIS — Z1339 Encounter for screening examination for other mental health and behavioral disorders: Secondary | ICD-10-CM | POA: Diagnosis not present

## 2022-11-17 DIAGNOSIS — F341 Dysthymic disorder: Secondary | ICD-10-CM | POA: Diagnosis not present

## 2022-11-17 DIAGNOSIS — I10 Essential (primary) hypertension: Secondary | ICD-10-CM | POA: Diagnosis not present

## 2022-12-30 ENCOUNTER — Ambulatory Visit (INDEPENDENT_AMBULATORY_CARE_PROVIDER_SITE_OTHER): Payer: BC Managed Care – PPO | Admitting: Orthopaedic Surgery

## 2022-12-30 DIAGNOSIS — S92502A Displaced unspecified fracture of left lesser toe(s), initial encounter for closed fracture: Secondary | ICD-10-CM | POA: Insufficient documentation

## 2022-12-30 NOTE — Progress Notes (Signed)
Patient ID: Dawn Maynard, female   DOB: [birth year], 42 y.o.   MRN: [id number]  Dawn Maynard comes in today to be evaluated for left fifth toe injury.  She stubbed the toe against her garden door and felt immediate pain and noticed an obvious deformity.  Xrays in the office showed a laterally deviated proximal phalanx fracture.  Clinically the toe was also laterally deviated.  No neurovascular compromise to the toe.  I administered a digital block and then performed a closed reduction of the toe.  Post reduction xrays showed satisfactory alignment of the fracture.  The fifth toe was buddy taped to the 4th and 3rd toes. She was placed in a short CAM boot and a postop shoe.  Instructions to take Vitamin D, Calcium, and zinc to promote fracture healing.  She will remove buddy tape in 2 days to check the skin.  She will come back next Tuesday for repeat xrays of the left 5th toe.  She will let me know if she needs me to send in something stronger than OTC meds.

## 2022-12-31 MED ORDER — HYDROCODONE-ACETAMINOPHEN 5-325 MG PO TABS: 1.0000 | ORAL_TABLET | Freq: Every day | ORAL | 0 refills | Status: AC | PRN

## 2022-12-31 NOTE — Addendum Note (Signed)
Addended by: Mayra Reel on: 12/31/2022 09:17 AM   Modules accepted: Orders

## 2023-01-05 ENCOUNTER — Other Ambulatory Visit (INDEPENDENT_AMBULATORY_CARE_PROVIDER_SITE_OTHER): Payer: BC Managed Care – PPO

## 2023-01-05 ENCOUNTER — Ambulatory Visit (INDEPENDENT_AMBULATORY_CARE_PROVIDER_SITE_OTHER): Payer: BC Managed Care – PPO | Admitting: Orthopaedic Surgery

## 2023-01-05 ENCOUNTER — Encounter: Payer: Self-pay | Admitting: Orthopaedic Surgery

## 2023-01-05 DIAGNOSIS — S92502A Displaced unspecified fracture of left lesser toe(s), initial encounter for closed fracture: Secondary | ICD-10-CM

## 2023-01-05 NOTE — Progress Notes (Signed)
   Office Visit Note   Patient: Dawn Maynard           Date of Birth: 12/10/1980           MRN: [id number] Visit Date: 01/05/2023              Requested by: Lewis Moccasin, MD [address] [place],  [place] [address] PCP: Lewis Moccasin, MD   Assessment & Plan: Visit Diagnoses:  1. Closed fracture of phalanx of left fifth toe, initial encounter     Plan: [name] is now 6 days status post left fifth toe fracture with closed reduction and buddy taping.  She has been doing well overall.  Symptoms have improved significantly.  X-rays demonstrate near anatomic alignment of the proximal phalanx fracture.  We will continue the cam boot for now.  Activity restrictions and modifications discussed.  Recheck in 2 weeks with two-view x-rays of the left fifth toe.  Follow-Up Instructions: Return in about 2 weeks (around 01/19/2023).   Orders:  Orders Placed This Encounter  Procedures   XR Toe 5th Left   No orders of the defined types were placed in this encounter.     Procedures: No procedures performed   Clinical Data: No additional findings.   Subjective: Chief Complaint  Patient presents with   Left Foot - Follow-up    Fifth toe fracture    HPI  Review of Systems   Objective: Vital Signs: There were no vitals taken for this visit.  Physical Exam  Ortho Exam  Specialty Comments:  No specialty comments available.  Imaging: No results found.   PMFS History: Patient Active Problem List   Diagnosis Date Noted   Closed fracture of fifth toe of left foot 12/30/2022   Pain of right heel 05/25/2019   History reviewed. No pertinent past medical history.  No family history on file.  Past Surgical History:  Procedure Laterality Date   BLADDER SUSPENSION     BREAST SURGERY     fasciotomy     HAMSTRING AVULSION REPAIR     LAPAROSCOPIC APPENDECTOMY N/A 10/29/2020   Procedure: APPENDECTOMY LAPAROSCOPIC;  Surgeon: Axel Filler, MD;  Location: [facility] OR;   Service: General;  Laterality: N/A;   WISDOM TOOTH EXTRACTION     Social History   Occupational History   Not on file  Tobacco Use   Smoking status: Never   Smokeless tobacco: Never  Substance and Sexual Activity   Alcohol use: Yes    Alcohol/week: 1.0 - 3.0 standard drink of alcohol    Types: 1 - 3 Glasses of wine per week   Drug use: Never   Sexual activity: Yes

## 2023-01-19 ENCOUNTER — Ambulatory Visit (INDEPENDENT_AMBULATORY_CARE_PROVIDER_SITE_OTHER): Payer: BC Managed Care – PPO | Admitting: Orthopaedic Surgery

## 2023-01-19 ENCOUNTER — Other Ambulatory Visit (INDEPENDENT_AMBULATORY_CARE_PROVIDER_SITE_OTHER): Payer: BC Managed Care – PPO

## 2023-01-19 DIAGNOSIS — N951 Menopausal and female climacteric states: Secondary | ICD-10-CM | POA: Diagnosis not present

## 2023-01-19 DIAGNOSIS — Z01419 Encounter for gynecological examination (general) (routine) without abnormal findings: Secondary | ICD-10-CM | POA: Diagnosis not present

## 2023-01-19 DIAGNOSIS — S92502A Displaced unspecified fracture of left lesser toe(s), initial encounter for closed fracture: Secondary | ICD-10-CM

## 2023-01-19 DIAGNOSIS — Z1231 Encounter for screening mammogram for malignant neoplasm of breast: Secondary | ICD-10-CM | POA: Diagnosis not present

## 2023-01-19 DIAGNOSIS — Z681 Body mass index (BMI) 19 or less, adult: Secondary | ICD-10-CM | POA: Diagnosis not present

## 2023-01-19 NOTE — Progress Notes (Signed)
   Office Visit Note   Patient: Dawn Maynard           Date of Birth: Jun 23, 1981           MRN: [id number] Visit Date: 01/19/2023              Requested by: Lewis Moccasin, MD [address] [place],  [place] [address] PCP: Lewis Moccasin, MD   Assessment & Plan: Visit Diagnoses:  1. Closed fracture of phalanx of left fifth toe, initial encounter     Plan: [name] is now approximately 3 weeks status post left fifth toe fracture.  Symptoms are predominantly due to soft tissue swelling.  Clinically the toe is still well aligned.  We provided her with a new cam boot today as the postop shoe is not supportive enough.  She will continue to wear this until symptoms improve at which point she can wean to a postop shoe.  Activity restrictions modifications reviewed.  Follow-up in 3 weeks with repeat two-view x-rays of the left fifth toe.  Follow-Up Instructions: Return in about 3 weeks (around 02/09/2023).   Orders:  Orders Placed This Encounter  Procedures   XR Toe 5th Left   No orders of the defined types were placed in this encounter.     Procedures: No procedures performed   Clinical Data: No additional findings.   Subjective: Chief Complaint  Patient presents with   Left Foot - Follow-up    HPI  Review of Systems   Objective: Vital Signs: There were no vitals taken for this visit.  Physical Exam  Ortho Exam  Specialty Comments:  No specialty comments available.  Imaging: XR Toe 5th Left  Result Date: 01/19/2023 X-rays demonstrate preserved length and alignment of the proximal phalanx fracture.  No significant signs of fracture healing yet.    PMFS History: Patient Active Problem List   Diagnosis Date Noted   Closed fracture of fifth toe of left foot 12/30/2022   Pain of right heel 05/25/2019   No past medical history on file.  No family history on file.  Past Surgical History:  Procedure Laterality Date   BLADDER SUSPENSION     BREAST  SURGERY     fasciotomy     HAMSTRING AVULSION REPAIR     LAPAROSCOPIC APPENDECTOMY N/A 10/29/2020   Procedure: APPENDECTOMY LAPAROSCOPIC;  Surgeon: Axel Filler, MD;  Location: [facility] OR;  Service: General;  Laterality: N/A;   WISDOM TOOTH EXTRACTION     Social History   Occupational History   Not on file  Tobacco Use   Smoking status: Never   Smokeless tobacco: Never  Substance and Sexual Activity   Alcohol use: Yes    Alcohol/week: 1.0 - 3.0 standard drink of alcohol    Types: 1 - 3 Glasses of wine per week   Drug use: Never   Sexual activity: Yes

## 2023-02-09 DIAGNOSIS — N959 Unspecified menopausal and perimenopausal disorder: Secondary | ICD-10-CM | POA: Diagnosis not present

## 2023-02-10 ENCOUNTER — Ambulatory Visit: Payer: BC Managed Care – PPO | Admitting: Orthopaedic Surgery

## 2023-02-10 ENCOUNTER — Other Ambulatory Visit (INDEPENDENT_AMBULATORY_CARE_PROVIDER_SITE_OTHER): Payer: BC Managed Care – PPO

## 2023-02-10 ENCOUNTER — Encounter: Payer: Self-pay | Admitting: Orthopaedic Surgery

## 2023-02-10 DIAGNOSIS — S92502A Displaced unspecified fracture of left lesser toe(s), initial encounter for closed fracture: Secondary | ICD-10-CM

## 2023-02-10 NOTE — Progress Notes (Signed)
   Office Visit Note   Patient: Dawn Maynard           Date of Birth: 05-01-1981           MRN: [id number] Visit Date: 02/10/2023              Requested by: Lewis Moccasin, MD [address] [place],  [place] [address] PCP: Lewis Moccasin, MD   Assessment & Plan: Visit Diagnoses:  1. Closed fracture of phalanx of left fifth toe, initial encounter     Plan: Overall I am happy with the healing of the toe.  Clinically she is feeling better as well.  She can continue to wear what ever shoes that she can comfortably wear.  We talked about appropriate activities to ease into.  Recheck in 6 weeks with two-view x-rays of the left fifth toe.  Follow-Up Instructions: Return in about 6 weeks (around 03/24/2023).   Orders:  Orders Placed This Encounter  Procedures   XR Toe 5th Left   No orders of the defined types were placed in this encounter.     Procedures: No procedures performed   Clinical Data: No additional findings.   Subjective: Chief Complaint  Patient presents with   Left Foot - Follow-up    Fifth toe fracture    HPI [name] returns today for follow-up for left fifth toe fracture.  She is exactly 6 weeks status post injury.  Overall she is doing much better.  She still has some discomfort when wearing regular shoes but she has transitioned out of the postop shoe and cam boot. Review of Systems   Objective: Vital Signs: There were no vitals taken for this visit.  Physical Exam  Ortho Exam Examination of the left fifth toe shows expected persistent swelling.  No neurovascular compromise.  No fracture tenderness. Specialty Comments:  No specialty comments available.  Imaging: XR Toe 5th Left  Result Date: 02/10/2023 X-rays show preserved alignment of the proximal phalangeal fracture.  There is callus formation which is consistent with continued fracture healing.    PMFS History: Patient Active Problem List   Diagnosis Date Noted   Closed fracture  of fifth toe of left foot 12/30/2022   Pain of right heel 05/25/2019   History reviewed. No pertinent past medical history.  No family history on file.  Past Surgical History:  Procedure Laterality Date   BLADDER SUSPENSION     BREAST SURGERY     fasciotomy     HAMSTRING AVULSION REPAIR     LAPAROSCOPIC APPENDECTOMY N/A 10/29/2020   Procedure: APPENDECTOMY LAPAROSCOPIC;  Surgeon: Axel Filler, MD;  Location: [facility] OR;  Service: General;  Laterality: N/A;   WISDOM TOOTH EXTRACTION     Social History   Occupational History   Not on file  Tobacco Use   Smoking status: Never   Smokeless tobacco: Never  Substance and Sexual Activity   Alcohol use: Yes    Alcohol/week: 1.0 - 3.0 standard drink of alcohol    Types: 1 - 3 Glasses of wine per week   Drug use: Never   Sexual activity: Yes

## 2023-02-19 DIAGNOSIS — J019 Acute sinusitis, unspecified: Secondary | ICD-10-CM | POA: Diagnosis not present

## 2023-03-09 DIAGNOSIS — J019 Acute sinusitis, unspecified: Secondary | ICD-10-CM | POA: Diagnosis not present

## 2023-03-09 DIAGNOSIS — R052 Subacute cough: Secondary | ICD-10-CM | POA: Diagnosis not present

## 2023-03-17 ENCOUNTER — Other Ambulatory Visit: Payer: Self-pay | Admitting: Orthopaedic Surgery

## 2023-03-24 ENCOUNTER — Ambulatory Visit: Payer: BC Managed Care – PPO | Admitting: Orthopaedic Surgery

## 2023-05-05 DIAGNOSIS — Z309 Encounter for contraceptive management, unspecified: Secondary | ICD-10-CM | POA: Diagnosis not present

## 2023-07-14 DIAGNOSIS — Z5181 Encounter for therapeutic drug level monitoring: Secondary | ICD-10-CM | POA: Diagnosis not present

## 2023-07-14 DIAGNOSIS — L409 Psoriasis, unspecified: Secondary | ICD-10-CM | POA: Diagnosis not present

## 2023-08-05 DIAGNOSIS — Z5181 Encounter for therapeutic drug level monitoring: Secondary | ICD-10-CM | POA: Diagnosis not present

## 2023-08-06 DIAGNOSIS — J189 Pneumonia, unspecified organism: Secondary | ICD-10-CM | POA: Diagnosis not present

## 2023-08-06 DIAGNOSIS — R051 Acute cough: Secondary | ICD-10-CM | POA: Diagnosis not present

## 2023-08-26 DIAGNOSIS — J189 Pneumonia, unspecified organism: Secondary | ICD-10-CM | POA: Diagnosis not present

## 2023-08-26 DIAGNOSIS — R0781 Pleurodynia: Secondary | ICD-10-CM | POA: Diagnosis not present

## 2023-08-26 DIAGNOSIS — S29019A Strain of muscle and tendon of unspecified wall of thorax, initial encounter: Secondary | ICD-10-CM | POA: Diagnosis not present

## 2023-10-25 ENCOUNTER — Other Ambulatory Visit: Payer: Self-pay | Admitting: Orthopaedic Surgery

## 2023-10-25 MED ORDER — METHYLPREDNISOLONE 4 MG PO TBPK: ORAL_TABLET | ORAL | 0 refills | Status: AC

## 2024-01-13 DIAGNOSIS — Z5181 Encounter for therapeutic drug level monitoring: Secondary | ICD-10-CM | POA: Diagnosis not present

## 2024-01-13 DIAGNOSIS — L409 Psoriasis, unspecified: Secondary | ICD-10-CM | POA: Diagnosis not present

## 2024-03-09 DIAGNOSIS — Z01419 Encounter for gynecological examination (general) (routine) without abnormal findings: Secondary | ICD-10-CM | POA: Diagnosis not present

## 2024-03-09 DIAGNOSIS — Z681 Body mass index (BMI) 19 or less, adult: Secondary | ICD-10-CM | POA: Diagnosis not present

## 2024-03-09 DIAGNOSIS — Z1231 Encounter for screening mammogram for malignant neoplasm of breast: Secondary | ICD-10-CM | POA: Diagnosis not present

## 2024-03-15 DIAGNOSIS — R7989 Other specified abnormal findings of blood chemistry: Secondary | ICD-10-CM | POA: Diagnosis not present

## 2024-03-22 DIAGNOSIS — R82998 Other abnormal findings in urine: Secondary | ICD-10-CM | POA: Diagnosis not present

## 2024-03-22 DIAGNOSIS — Z1331 Encounter for screening for depression: Secondary | ICD-10-CM | POA: Diagnosis not present

## 2024-03-22 DIAGNOSIS — Z1339 Encounter for screening examination for other mental health and behavioral disorders: Secondary | ICD-10-CM | POA: Diagnosis not present

## 2024-03-22 DIAGNOSIS — Z0000 Encounter for general adult medical examination without abnormal findings: Secondary | ICD-10-CM | POA: Diagnosis not present

## 2024-03-22 DIAGNOSIS — Z23 Encounter for immunization: Secondary | ICD-10-CM | POA: Diagnosis not present

## 2024-03-22 DIAGNOSIS — G43109 Migraine with aura, not intractable, without status migrainosus: Secondary | ICD-10-CM | POA: Diagnosis not present

## 2024-05-18 DIAGNOSIS — Z23 Encounter for immunization: Secondary | ICD-10-CM | POA: Diagnosis not present

## 2024-05-29 ENCOUNTER — Encounter: Payer: Self-pay | Admitting: Radiology

## 2024-07-24 DIAGNOSIS — J069 Acute upper respiratory infection, unspecified: Secondary | ICD-10-CM | POA: Diagnosis not present

## 2024-07-24 DIAGNOSIS — R051 Acute cough: Secondary | ICD-10-CM | POA: Diagnosis not present
